# Patient Record
Sex: Male | Born: 1995
Health system: Southern US, Community
[De-identification: ages and names within clinical notes are randomized; demographics above are authoritative.]

## PROBLEM LIST (undated history)

## (undated) HISTORY — PX: OTHER SURGICAL HISTORY: SHX169

## (undated) HISTORY — PX: EYE SURGERY: SHX253

## (undated) HISTORY — PX: TONSILLECTOMY: SUR1361

---
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
## 2015-01-18 ENCOUNTER — Encounter: Payer: BLUE CROSS/BLUE SHIELD | Attending: Family Medicine | Admitting: Dietician

## 2015-01-18 VITALS — Ht 72.0 in | Wt 153.0 lb

## 2015-01-18 DIAGNOSIS — Z713 Dietary counseling and surveillance: Secondary | ICD-10-CM | POA: Insufficient documentation

## 2015-01-18 DIAGNOSIS — K5900 Constipation, unspecified: Secondary | ICD-10-CM | POA: Diagnosis not present

## 2015-01-18 NOTE — Patient Instructions (Signed)
Continue a high fiber diet with adequate fluid intake. Continue the yogurt daily as this helps with your constipation. Recommend breakfast daily.  Eat when you are hungry.  You are choosing healthy choices. Continue exercise most days.  Biking more often for more aerobic exercise.   Have a light snack with carbohydrates prior to aerobic exercise.   If you are feeling shaky, weak, sweaty, or grumpy, these may be signs that your blood sugar is low ant that you need to eat. Have a variety of foods in your diet.  Consider trying Quinoa or whole wheat pancakes. Pay attention to your bodies signals. Please e-mail me with any questions or contact Helen HashimotoJill Shaw, RD at [facility].

## 2015-01-18 NOTE — Progress Notes (Signed)
Medical Nutrition Therapy:  Appt start time: 0800 end time:  0900.   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  He lives with his parents and commutes to school.  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  H is Columbian/American.  He prepares his own meals.  The referral was for constipation.  Patient states that he was having a bowel movement every other day but that it was hard and he was feeling bloated.  Currently states that constipation has resolved.  He started taking a probiotic daily which he reported no success, stopped this, began yogurt daily which worked well and now has 1-2 soft bowel movements daily.  He reports eating about 40 grams fiber daily.  He complains of increased bloating with gluten although reports that he did not eliminate this intentionally.  He desires to decrease his %body fat, tried a ketogenic diet but did not feel well.  He now reports intermittent fasting.  (Fasting from 9pm-12 pm daily.)  Does not eat when he is hungry and just drinks a lot of water to combat the hunger.  He will eat whatever he wants occasionally.  His weight is appropriate for his height and body structure.  He asked further questions regarding the paleo diet and genetics diet.Marland Kitchen  TANITA  BODY COMP RESULTS 01/18/15 151.5 lbs   BMI (kg/m^2) 20.5   Fat Mass (lbs) 20   Fat Free Mass (lbs) 131.5   Total Body Water (lbs) 96.5    Preferred Learning Style:   No preference indicated   Learning Readiness:   Ready   MEDICATIONS: see list   DIETARY INTAKE: 24-hr recall:  B (12-1 AM): skips Snk ( AM): none  L ( PM): 2 servings of oatmeal with almond milk, fruit, 4 eggs with cheese and 100 gm egg whites Snk ( PM): none D ( PM):  Rice, beans, mixed vegetables, 130 gm chicken breast, 100 gm broccoli, shake with water apple, spinach, apple cider vinegar Snk ( PM): sweet potatoes, spoon of natural peanut  butter Beverages:   Usual physical activity: weights for upper and lower body 3 times per week, bike 2-3 times per week for 30 minutes but decreased this recently due to stress fracture and "not feeling like it".  He describes symptoms of weak legs and fatigue which he relates to low blood sugar.  Estimated energy needs: 2700 calories 80 g protein  Progress Towards Goal(s):  In progress.   Nutritional Diagnosis:  NB-1.1 Food and nutrition-related knowledge deficit NB-1.2 Harmful beliefs/attitudes about food or nutrition-related topics (use with caution) As related to weight.  As evidenced by dieting with normal weight.    Intervention:  Nutrition education/counseling regarding healthy eating.  Recommended patient not limit gluten unless advised by MD.  Recommended regularly scheduled meals and to listen to his body and eat when hungry.  Recommended increasing variety in his diet.  Questions answered.  Discussed that current weight is healthy and importance of adequate intake.  Continue a high fiber diet with adequate fluid intake. Continue the yogurt daily as this helps with your constipation. Recommend breakfast daily.  Eat when you are hungry.  You are choosing healthy choices. Continue exercise most days.  Biking more often for more aerobic exercise.   Have a light snack with carbohydrates prior to aerobic exercise.   If you are feeling shaky, weak, sweaty, or grumpy, these may be signs that  your blood sugar is low ant that you need to eat. Have a variety of foods in your diet.  Consider trying Quinoa or whole wheat pancakes. Pay attention to your bodies signals. Please e-mail me with any questions or contact Helen HashimotoJill Shaw, RD at [facility].   Teaching Method Utilized:  Visual Auditory   Handouts given during visit include:  Constipation nutrition therapy  Fiber list  Barriers to learning/adherence to lifestyle change: none  Demonstrated degree of understanding via:  Teach Back    Monitoring/Evaluation:  Dietary intake, exercise, and body weight prn.

## 2015-08-27 ENCOUNTER — Ambulatory Visit (INDEPENDENT_AMBULATORY_CARE_PROVIDER_SITE_OTHER): Payer: BLUE CROSS/BLUE SHIELD | Admitting: Emergency Medicine

## 2015-08-27 VITALS — BP 110/74 | HR 63 | Temp 98.5°F | Resp 12 | Ht 73.0 in | Wt 160.0 lb

## 2015-08-27 DIAGNOSIS — J029 Acute pharyngitis, unspecified: Secondary | ICD-10-CM | POA: Diagnosis not present

## 2015-08-27 LAB — POCT RAPID STREP A (OFFICE): Rapid Strep A Screen: NEGATIVE

## 2015-08-27 MED ORDER — FIRST-DUKES MOUTHWASH MT SUSP: OROMUCOSAL | Status: DC

## 2015-08-27 NOTE — Progress Notes (Addendum)
This chart was scribed for Earl Lites, MD by [facility], medical scribe at [facility].The patient was seen in exam room 12 and the patient's care was started at 11:51 AM.  Chief Complaint:  Chief Complaint  Patient presents with  . Sore Throat    Thrusday Discharge    HPI: Jon Leach is a 20 y.o. male who reports to [facility] today complaining of a sore throat with associated nasal congestion for the past two days. Headache with yesterday but not today. No known sick contacts. No fever, chills or body aches. He studies kinesiology   No past medical history on file. Past Surgical History  Procedure Laterality Date  . Tooth      wisdome tooth removed   Social History   Social History  . Marital Status: Single    Spouse Name: N/A  . Number of Children: N/A  . Years of Education: N/A   Social History Main Topics  . Smoking status: Never Smoker   . Smokeless tobacco: None  . Alcohol Use: No  . Drug Use: No  . Sexual Activity: Yes   Other Topics Concern  . None   Social History Narrative   No family history on file. Allergies  Allergen Reactions  . Penicillins    Prior to Admission medications   Medication Sig Start Date End Date Taking? Authorizing Provider  calcium citrate-vitamin D (CITRACAL+D) 315-200 MG-UNIT per tablet Take 1 tablet by mouth 2 (two) times daily.   Yes Historical Provider, MD  desloratadine-pseudoephedrine (CLARINEX-D 12-HOUR) 2.5-120 MG per tablet Take 1 tablet by mouth 2 (two) times daily.   Yes Historical Provider, MD  Multiple Vitamins-Minerals (MULTIVITAMIN PO) Take by mouth.   Yes Historical Provider, MD  omega-3 acid ethyl esters (LOVAZA) 1 G capsule Take by mouth 2 (two) times daily.   Yes Historical Provider, MD   ROS: The patient denies fevers, chills, night sweats, unintentional weight loss, chest pain, palpitations, wheezing, dyspnea on exertion, nausea, vomiting, abdominal pain, dysuria, hematuria, melena,  numbness, weakness, or tingling.  All other systems have been reviewed and were otherwise negative with the exception of those mentioned in the HPI and as above.    PHYSICAL EXAM: Filed Vitals:   08/27/15 1115  BP: 110/74  Pulse: 63  Temp: 98.5 F (36.9 C)  Resp: 12   Body mass index is 21.11 kg/(m^2).  General: Alert, no acute distress HEENT:  Normocephalic, atraumatic, oropharynx patent. Redness at the posterior oropharynx. There are small bilateral anterior cervical nodes. Eye: [facility] Cardiovascular:  Regular rate and rhythm, no rubs murmurs or gallops.  No Carotid bruits, radial pulse intact. No pedal edema.  Respiratory: Clear to auscultation bilaterally.  No wheezes, rales, or rhonchi.  No cyanosis, no use of accessory musculature Abdominal: No organomegaly, abdomen is soft and non-tender, positive bowel sounds.  No masses. Musculoskeletal: Gait intact. No edema, tenderness Skin: No rashes. Neurologic: Facial musculature symmetric. Psychiatric: Patient acts appropriately throughout our interaction. Lymphatic: No cervical or submandibular lymphadenopathy Genitourinary/Anorectal: No acute findings  LABS: Results for orders placed or performed in visit on 08/27/15  POCT rapid strep A  Result Value Ref Range   Rapid Strep A Screen Negative Negative   ASSESSMENT/PLAN: We'll treat with [facility]'s mouthwash and symptomatically treatment. Throat culture was sent. Gross sideeffects, risk and benefits, and alternatives of medications d/w patient. Patient is aware that all medications have potential sideeffects and we are unable to predict every sideeffect or  drug-drug interaction that may occur.  By signing my name below, I, Nadim Abuhashem, attest that this documentation has been prepared under the direction and in the presence of Earl Lites, MD.  Electronically Signed: Conchita Paris, medical scribe. 08/27/2015, 12:16 AM.  Lesle Chris MD 08/27/2015 11:51 AM

## 2015-08-27 NOTE — Patient Instructions (Signed)

## 2015-08-27 NOTE — Addendum Note (Signed)
Addended by: Lesle Chris A on: 08/27/2015 12:28 PM   Modules accepted: Kipp Brood

## 2015-08-29 LAB — CULTURE, GROUP A STREP: ORGANISM ID, BACTERIA: NORMAL

## 2015-10-31 ENCOUNTER — Other Ambulatory Visit: Payer: Self-pay | Admitting: Family Medicine

## 2015-10-31 DIAGNOSIS — M84374A Stress fracture, right foot, initial encounter for fracture: Secondary | ICD-10-CM

## 2015-11-06 ENCOUNTER — Ambulatory Visit
Admission: RE | Admit: 2015-11-06 | Discharge: 2015-11-06 | Disposition: A | Discharge status: Home / Self Care | Payer: BLUE CROSS/BLUE SHIELD | Source: Ambulatory Visit | Attending: Family Medicine | Admitting: Family Medicine

## 2015-11-06 DIAGNOSIS — M84374A Stress fracture, right foot, initial encounter for fracture: Secondary | ICD-10-CM

## 2016-05-14 ENCOUNTER — Other Ambulatory Visit: Payer: Self-pay | Admitting: Internal Medicine

## 2016-05-14 DIAGNOSIS — M79671 Pain in right foot: Secondary | ICD-10-CM

## 2016-05-14 DIAGNOSIS — M79672 Pain in left foot: Principal | ICD-10-CM

## 2016-05-23 ENCOUNTER — Other Ambulatory Visit: Payer: BLUE CROSS/BLUE SHIELD

## 2016-05-28 ENCOUNTER — Other Ambulatory Visit: Payer: Self-pay | Admitting: Internal Medicine

## 2016-05-28 ENCOUNTER — Ambulatory Visit
Admission: RE | Admit: 2016-05-28 | Discharge: 2016-05-28 | Disposition: A | Discharge status: Home / Self Care | Payer: BLUE CROSS/BLUE SHIELD | Source: Ambulatory Visit | Attending: Internal Medicine | Admitting: Internal Medicine

## 2016-05-28 DIAGNOSIS — M79671 Pain in right foot: Secondary | ICD-10-CM

## 2016-05-28 DIAGNOSIS — E559 Vitamin D deficiency, unspecified: Secondary | ICD-10-CM

## 2016-05-28 DIAGNOSIS — M79672 Pain in left foot: Principal | ICD-10-CM

## 2016-08-31 DIAGNOSIS — M9905 Segmental and somatic dysfunction of pelvic region: Secondary | ICD-10-CM | POA: Diagnosis not present

## 2016-08-31 DIAGNOSIS — M9903 Segmental and somatic dysfunction of lumbar region: Secondary | ICD-10-CM | POA: Diagnosis not present

## 2016-08-31 DIAGNOSIS — M9902 Segmental and somatic dysfunction of thoracic region: Secondary | ICD-10-CM | POA: Diagnosis not present

## 2016-08-31 DIAGNOSIS — M9901 Segmental and somatic dysfunction of cervical region: Secondary | ICD-10-CM | POA: Diagnosis not present

## 2016-09-05 DIAGNOSIS — M545 Low back pain: Secondary | ICD-10-CM | POA: Diagnosis not present

## 2016-09-12 DIAGNOSIS — M545 Low back pain: Secondary | ICD-10-CM | POA: Diagnosis not present

## 2016-09-19 DIAGNOSIS — M545 Low back pain: Secondary | ICD-10-CM | POA: Diagnosis not present

## 2016-09-26 DIAGNOSIS — M545 Low back pain: Secondary | ICD-10-CM | POA: Diagnosis not present

## 2016-10-03 DIAGNOSIS — M545 Low back pain: Secondary | ICD-10-CM | POA: Diagnosis not present

## 2016-10-05 DIAGNOSIS — M9903 Segmental and somatic dysfunction of lumbar region: Secondary | ICD-10-CM | POA: Diagnosis not present

## 2016-10-05 DIAGNOSIS — M9901 Segmental and somatic dysfunction of cervical region: Secondary | ICD-10-CM | POA: Diagnosis not present

## 2016-10-05 DIAGNOSIS — M9905 Segmental and somatic dysfunction of pelvic region: Secondary | ICD-10-CM | POA: Diagnosis not present

## 2016-10-05 DIAGNOSIS — M9902 Segmental and somatic dysfunction of thoracic region: Secondary | ICD-10-CM | POA: Diagnosis not present

## 2016-10-10 DIAGNOSIS — M545 Low back pain: Secondary | ICD-10-CM | POA: Diagnosis not present

## 2016-10-24 DIAGNOSIS — M545 Low back pain: Secondary | ICD-10-CM | POA: Diagnosis not present

## 2016-10-31 DIAGNOSIS — M545 Low back pain: Secondary | ICD-10-CM | POA: Diagnosis not present

## 2016-11-02 DIAGNOSIS — M9902 Segmental and somatic dysfunction of thoracic region: Secondary | ICD-10-CM | POA: Diagnosis not present

## 2016-11-02 DIAGNOSIS — M9903 Segmental and somatic dysfunction of lumbar region: Secondary | ICD-10-CM | POA: Diagnosis not present

## 2016-11-02 DIAGNOSIS — M9901 Segmental and somatic dysfunction of cervical region: Secondary | ICD-10-CM | POA: Diagnosis not present

## 2016-11-02 DIAGNOSIS — M9905 Segmental and somatic dysfunction of pelvic region: Secondary | ICD-10-CM | POA: Diagnosis not present

## 2016-11-07 DIAGNOSIS — M545 Low back pain: Secondary | ICD-10-CM | POA: Diagnosis not present

## 2016-11-29 DIAGNOSIS — M545 Low back pain: Secondary | ICD-10-CM | POA: Diagnosis not present

## 2016-11-29 DIAGNOSIS — M25551 Pain in right hip: Secondary | ICD-10-CM | POA: Diagnosis not present

## 2016-11-29 DIAGNOSIS — M25552 Pain in left hip: Secondary | ICD-10-CM | POA: Diagnosis not present

## 2016-12-20 DIAGNOSIS — M25552 Pain in left hip: Secondary | ICD-10-CM | POA: Diagnosis not present

## 2016-12-20 DIAGNOSIS — M25551 Pain in right hip: Secondary | ICD-10-CM | POA: Diagnosis not present

## 2016-12-20 DIAGNOSIS — M545 Low back pain: Secondary | ICD-10-CM | POA: Diagnosis not present

## 2017-01-01 DIAGNOSIS — M545 Low back pain: Secondary | ICD-10-CM | POA: Diagnosis not present

## 2017-01-01 DIAGNOSIS — M25551 Pain in right hip: Secondary | ICD-10-CM | POA: Diagnosis not present

## 2017-01-01 DIAGNOSIS — M25552 Pain in left hip: Secondary | ICD-10-CM | POA: Diagnosis not present

## 2017-01-10 DIAGNOSIS — M545 Low back pain: Secondary | ICD-10-CM | POA: Diagnosis not present

## 2017-01-10 DIAGNOSIS — M25552 Pain in left hip: Secondary | ICD-10-CM | POA: Diagnosis not present

## 2017-01-10 DIAGNOSIS — M25551 Pain in right hip: Secondary | ICD-10-CM | POA: Diagnosis not present

## 2017-01-15 DIAGNOSIS — M545 Low back pain: Secondary | ICD-10-CM | POA: Diagnosis not present

## 2017-01-15 DIAGNOSIS — M25552 Pain in left hip: Secondary | ICD-10-CM | POA: Diagnosis not present

## 2017-01-15 DIAGNOSIS — M25551 Pain in right hip: Secondary | ICD-10-CM | POA: Diagnosis not present

## 2017-01-22 DIAGNOSIS — M545 Low back pain: Secondary | ICD-10-CM | POA: Diagnosis not present

## 2017-01-22 DIAGNOSIS — M25552 Pain in left hip: Secondary | ICD-10-CM | POA: Diagnosis not present

## 2017-01-22 DIAGNOSIS — M25551 Pain in right hip: Secondary | ICD-10-CM | POA: Diagnosis not present

## 2017-02-07 DIAGNOSIS — M545 Low back pain: Secondary | ICD-10-CM | POA: Diagnosis not present

## 2017-02-07 DIAGNOSIS — M25552 Pain in left hip: Secondary | ICD-10-CM | POA: Diagnosis not present

## 2017-02-07 DIAGNOSIS — M25551 Pain in right hip: Secondary | ICD-10-CM | POA: Diagnosis not present

## 2017-06-10 ENCOUNTER — Encounter (HOSPITAL_COMMUNITY): Payer: Self-pay | Admitting: Emergency Medicine

## 2017-06-10 ENCOUNTER — Ambulatory Visit (HOSPITAL_COMMUNITY)
Admission: EM | Admit: 2017-06-10 | Discharge: 2017-06-10 | Disposition: A | Discharge status: Home / Self Care | Payer: BLUE CROSS/BLUE SHIELD | Attending: Family Medicine | Admitting: Family Medicine

## 2017-06-10 DIAGNOSIS — B349 Viral infection, unspecified: Secondary | ICD-10-CM | POA: Diagnosis not present

## 2017-06-10 DIAGNOSIS — J029 Acute pharyngitis, unspecified: Secondary | ICD-10-CM | POA: Diagnosis not present

## 2017-06-10 DIAGNOSIS — Z880 Allergy status to penicillin: Secondary | ICD-10-CM | POA: Diagnosis not present

## 2017-06-10 LAB — POCT RAPID STREP A: STREPTOCOCCUS, GROUP A SCREEN (DIRECT): NEGATIVE

## 2017-06-10 MED ORDER — IBUPROFEN 800 MG PO TABS
800.0000 mg | ORAL_TABLET | Freq: Once | ORAL | Status: AC
  Administered 2017-06-10: 800 mg via ORAL

## 2017-06-10 MED ORDER — OSELTAMIVIR PHOSPHATE 75 MG PO CAPS: 75.0000 mg | ORAL_CAPSULE | Freq: Two times a day (BID) | ORAL | 0 refills | Status: DC

## 2017-06-10 MED ORDER — IBUPROFEN 800 MG PO TABS
ORAL_TABLET | ORAL | Status: AC
  Filled 2017-06-10: qty 1

## 2017-06-10 NOTE — ED Triage Notes (Signed)
Sore throat, fever, body aches, congestion, chills, and headache that started Saturday.

## 2017-06-10 NOTE — Discharge Instructions (Signed)
The strep test is negative.  We are running a back up test.  In the meantime, I am treating you for the flu.  Ibuprofen/Tylenol for soreness and headache.

## 2017-06-10 NOTE — ED Provider Notes (Signed)
  [facility]   [id number] 06/10/17 Arrival Time: 1555   SUBJECTIVE:  Jon Leach is a 21 y.o. male who presents to the urgent care with complaint of Sore throat, fever, body aches, congestion, chills, and headache that started Saturday.  Works as a host at [employer]. History reviewed. No pertinent past medical history. History reviewed. No pertinent family history. Social History   Socioeconomic History  . Marital status: Single    Spouse name: Not on file  . Number of children: Not on file  . Years of education: Not on file  . Highest education level: Not on file  Social Needs  . Financial resource strain: Not on file  . Food insecurity - worry: Not on file  . Food insecurity - inability: Not on file  . Transportation needs - medical: Not on file  . Transportation needs - non-medical: Not on file  Occupational History  . Not on file  Tobacco Use  . Smoking status: Never Smoker  . Smokeless tobacco: Never Used  Substance and Sexual Activity  . Alcohol use: Yes    Comment: occ.   . Drug use: No  . Sexual activity: Yes  Other Topics Concern  . Not on file  Social History Narrative  . Not on file   No outpatient medications have been marked as taking for the 06/10/17 encounter [facility](Hospital Encounter).   Allergies  Allergen Reactions  . Penicillins       ROS: As per HPI, remainder of ROS negative.   OBJECTIVE:   Vitals:   06/10/17 1611 06/10/17 1612  BP:  106/64  Pulse:  (!) 125  Resp:  16  Temp:  100.1 F (37.8 C)  TempSrc:  Oral  SpO2:  97%  Weight: 150 lb (68 kg)   Height: 6\' 1"  (1.854 m)      General appearance: alert; no distress Eyes: PERRL; EOMI; conjunctiva normal HENT: normocephalic; atraumatic; TMs normal, canal normal, external ears normal without trauma; nasal mucosa normal; very red swollen tonsils. Neck: supple, anterior cervical adenopathy Lungs: clear to auscultation bilaterally Heart: regular rate and rhythm Abdomen:  soft, non-tender; bowel sounds normal; no masses or organomegaly; no guarding or rebound tenderness Back: no CVA tenderness Extremities: no cyanosis or edema; symmetrical with no gross deformities Skin: warm and dry Neurologic: normal gait; grossly normal Psychological: alert and cooperative; normal mood and affect      Labs:  Results for orders placed or performed during the hospital encounter of 06/10/17  POCT rapid strep A [facility]  Result Value Ref Range   Streptococcus, Group A Screen (Direct) NEGATIVE NEGATIVE    Labs Reviewed  POCT RAPID STREP A    No results found.     ASSESSMENT & PLAN:  1. Viral syndrome     Meds ordered this encounter  Medications  . ibuprofen (ADVIL,MOTRIN) tablet 800 mg  . oseltamivir (TAMIFLU) 75 MG capsule    Sig: Take 1 capsule (75 mg total) by mouth every 12 (twelve) hours.    Dispense:  10 capsule    Refill:  0    Reviewed expectations re: course of current medical issues. Questions answered. Outlined signs and symptoms indicating need for more acute intervention. Patient verbalized understanding. After Visit Summary given.    Procedures:      Elvina SidleLauenstein, Layne Lebon, MD 06/10/17 1645

## 2017-06-11 LAB — CULTURE, GROUP A STREP (THRC)

## 2017-06-14 DIAGNOSIS — M9902 Segmental and somatic dysfunction of thoracic region: Secondary | ICD-10-CM | POA: Diagnosis not present

## 2017-06-14 DIAGNOSIS — M9905 Segmental and somatic dysfunction of pelvic region: Secondary | ICD-10-CM | POA: Diagnosis not present

## 2017-06-14 DIAGNOSIS — M9903 Segmental and somatic dysfunction of lumbar region: Secondary | ICD-10-CM | POA: Diagnosis not present

## 2017-06-14 DIAGNOSIS — M9901 Segmental and somatic dysfunction of cervical region: Secondary | ICD-10-CM | POA: Diagnosis not present

## 2017-06-20 DIAGNOSIS — M9903 Segmental and somatic dysfunction of lumbar region: Secondary | ICD-10-CM | POA: Diagnosis not present

## 2017-06-20 DIAGNOSIS — M9902 Segmental and somatic dysfunction of thoracic region: Secondary | ICD-10-CM | POA: Diagnosis not present

## 2017-06-20 DIAGNOSIS — M9905 Segmental and somatic dysfunction of pelvic region: Secondary | ICD-10-CM | POA: Diagnosis not present

## 2017-06-20 DIAGNOSIS — M9901 Segmental and somatic dysfunction of cervical region: Secondary | ICD-10-CM | POA: Diagnosis not present

## 2017-08-26 DIAGNOSIS — M79672 Pain in left foot: Secondary | ICD-10-CM | POA: Diagnosis not present

## 2017-08-26 DIAGNOSIS — M79671 Pain in right foot: Secondary | ICD-10-CM | POA: Diagnosis not present

## 2017-09-19 DIAGNOSIS — L308 Other specified dermatitis: Secondary | ICD-10-CM | POA: Diagnosis not present

## 2017-09-19 DIAGNOSIS — B354 Tinea corporis: Secondary | ICD-10-CM | POA: Diagnosis not present

## 2017-09-19 DIAGNOSIS — L700 Acne vulgaris: Secondary | ICD-10-CM | POA: Diagnosis not present

## 2017-11-18 DIAGNOSIS — M9903 Segmental and somatic dysfunction of lumbar region: Secondary | ICD-10-CM | POA: Diagnosis not present

## 2017-11-18 DIAGNOSIS — M9905 Segmental and somatic dysfunction of pelvic region: Secondary | ICD-10-CM | POA: Diagnosis not present

## 2017-11-18 DIAGNOSIS — M9901 Segmental and somatic dysfunction of cervical region: Secondary | ICD-10-CM | POA: Diagnosis not present

## 2017-11-18 DIAGNOSIS — M9902 Segmental and somatic dysfunction of thoracic region: Secondary | ICD-10-CM | POA: Diagnosis not present

## 2017-11-27 DIAGNOSIS — M9901 Segmental and somatic dysfunction of cervical region: Secondary | ICD-10-CM | POA: Diagnosis not present

## 2017-11-27 DIAGNOSIS — M9903 Segmental and somatic dysfunction of lumbar region: Secondary | ICD-10-CM | POA: Diagnosis not present

## 2017-11-27 DIAGNOSIS — M9902 Segmental and somatic dysfunction of thoracic region: Secondary | ICD-10-CM | POA: Diagnosis not present

## 2017-11-27 DIAGNOSIS — M9905 Segmental and somatic dysfunction of pelvic region: Secondary | ICD-10-CM | POA: Diagnosis not present

## 2017-12-05 DIAGNOSIS — M9901 Segmental and somatic dysfunction of cervical region: Secondary | ICD-10-CM | POA: Diagnosis not present

## 2017-12-05 DIAGNOSIS — M9903 Segmental and somatic dysfunction of lumbar region: Secondary | ICD-10-CM | POA: Diagnosis not present

## 2017-12-05 DIAGNOSIS — M9902 Segmental and somatic dysfunction of thoracic region: Secondary | ICD-10-CM | POA: Diagnosis not present

## 2017-12-05 DIAGNOSIS — M9905 Segmental and somatic dysfunction of pelvic region: Secondary | ICD-10-CM | POA: Diagnosis not present

## 2017-12-11 DIAGNOSIS — M9903 Segmental and somatic dysfunction of lumbar region: Secondary | ICD-10-CM | POA: Diagnosis not present

## 2017-12-11 DIAGNOSIS — M9905 Segmental and somatic dysfunction of pelvic region: Secondary | ICD-10-CM | POA: Diagnosis not present

## 2017-12-11 DIAGNOSIS — M9901 Segmental and somatic dysfunction of cervical region: Secondary | ICD-10-CM | POA: Diagnosis not present

## 2017-12-11 DIAGNOSIS — M9902 Segmental and somatic dysfunction of thoracic region: Secondary | ICD-10-CM | POA: Diagnosis not present

## 2017-12-19 DIAGNOSIS — M9902 Segmental and somatic dysfunction of thoracic region: Secondary | ICD-10-CM | POA: Diagnosis not present

## 2017-12-19 DIAGNOSIS — M9905 Segmental and somatic dysfunction of pelvic region: Secondary | ICD-10-CM | POA: Diagnosis not present

## 2017-12-19 DIAGNOSIS — M9901 Segmental and somatic dysfunction of cervical region: Secondary | ICD-10-CM | POA: Diagnosis not present

## 2017-12-19 DIAGNOSIS — M9903 Segmental and somatic dysfunction of lumbar region: Secondary | ICD-10-CM | POA: Diagnosis not present

## 2017-12-25 DIAGNOSIS — M9901 Segmental and somatic dysfunction of cervical region: Secondary | ICD-10-CM | POA: Diagnosis not present

## 2017-12-25 DIAGNOSIS — M9905 Segmental and somatic dysfunction of pelvic region: Secondary | ICD-10-CM | POA: Diagnosis not present

## 2017-12-25 DIAGNOSIS — M9902 Segmental and somatic dysfunction of thoracic region: Secondary | ICD-10-CM | POA: Diagnosis not present

## 2017-12-25 DIAGNOSIS — M9903 Segmental and somatic dysfunction of lumbar region: Secondary | ICD-10-CM | POA: Diagnosis not present

## 2018-01-01 DIAGNOSIS — M9902 Segmental and somatic dysfunction of thoracic region: Secondary | ICD-10-CM | POA: Diagnosis not present

## 2018-01-01 DIAGNOSIS — M9903 Segmental and somatic dysfunction of lumbar region: Secondary | ICD-10-CM | POA: Diagnosis not present

## 2018-01-01 DIAGNOSIS — M9905 Segmental and somatic dysfunction of pelvic region: Secondary | ICD-10-CM | POA: Diagnosis not present

## 2018-01-01 DIAGNOSIS — M9901 Segmental and somatic dysfunction of cervical region: Secondary | ICD-10-CM | POA: Diagnosis not present

## 2018-01-08 DIAGNOSIS — M9902 Segmental and somatic dysfunction of thoracic region: Secondary | ICD-10-CM | POA: Diagnosis not present

## 2018-01-08 DIAGNOSIS — M9903 Segmental and somatic dysfunction of lumbar region: Secondary | ICD-10-CM | POA: Diagnosis not present

## 2018-01-08 DIAGNOSIS — M9901 Segmental and somatic dysfunction of cervical region: Secondary | ICD-10-CM | POA: Diagnosis not present

## 2018-01-08 DIAGNOSIS — M9905 Segmental and somatic dysfunction of pelvic region: Secondary | ICD-10-CM | POA: Diagnosis not present

## 2018-01-22 DIAGNOSIS — M9903 Segmental and somatic dysfunction of lumbar region: Secondary | ICD-10-CM | POA: Diagnosis not present

## 2018-01-22 DIAGNOSIS — M9905 Segmental and somatic dysfunction of pelvic region: Secondary | ICD-10-CM | POA: Diagnosis not present

## 2018-01-22 DIAGNOSIS — M9902 Segmental and somatic dysfunction of thoracic region: Secondary | ICD-10-CM | POA: Diagnosis not present

## 2018-01-22 DIAGNOSIS — M9901 Segmental and somatic dysfunction of cervical region: Secondary | ICD-10-CM | POA: Diagnosis not present

## 2018-01-29 DIAGNOSIS — M9901 Segmental and somatic dysfunction of cervical region: Secondary | ICD-10-CM | POA: Diagnosis not present

## 2018-01-29 DIAGNOSIS — M9903 Segmental and somatic dysfunction of lumbar region: Secondary | ICD-10-CM | POA: Diagnosis not present

## 2018-01-29 DIAGNOSIS — M9905 Segmental and somatic dysfunction of pelvic region: Secondary | ICD-10-CM | POA: Diagnosis not present

## 2018-01-29 DIAGNOSIS — M9902 Segmental and somatic dysfunction of thoracic region: Secondary | ICD-10-CM | POA: Diagnosis not present

## 2018-02-04 DIAGNOSIS — M9903 Segmental and somatic dysfunction of lumbar region: Secondary | ICD-10-CM | POA: Diagnosis not present

## 2018-02-04 DIAGNOSIS — M9905 Segmental and somatic dysfunction of pelvic region: Secondary | ICD-10-CM | POA: Diagnosis not present

## 2018-02-04 DIAGNOSIS — M9901 Segmental and somatic dysfunction of cervical region: Secondary | ICD-10-CM | POA: Diagnosis not present

## 2018-02-04 DIAGNOSIS — M9902 Segmental and somatic dysfunction of thoracic region: Secondary | ICD-10-CM | POA: Diagnosis not present

## 2018-02-27 DIAGNOSIS — M9901 Segmental and somatic dysfunction of cervical region: Secondary | ICD-10-CM | POA: Diagnosis not present

## 2018-02-27 DIAGNOSIS — M9903 Segmental and somatic dysfunction of lumbar region: Secondary | ICD-10-CM | POA: Diagnosis not present

## 2018-02-27 DIAGNOSIS — M9902 Segmental and somatic dysfunction of thoracic region: Secondary | ICD-10-CM | POA: Diagnosis not present

## 2018-02-27 DIAGNOSIS — M9905 Segmental and somatic dysfunction of pelvic region: Secondary | ICD-10-CM | POA: Diagnosis not present

## 2018-03-08 DIAGNOSIS — M9901 Segmental and somatic dysfunction of cervical region: Secondary | ICD-10-CM | POA: Diagnosis not present

## 2018-03-08 DIAGNOSIS — M9905 Segmental and somatic dysfunction of pelvic region: Secondary | ICD-10-CM | POA: Diagnosis not present

## 2018-03-08 DIAGNOSIS — M9902 Segmental and somatic dysfunction of thoracic region: Secondary | ICD-10-CM | POA: Diagnosis not present

## 2018-03-08 DIAGNOSIS — M9903 Segmental and somatic dysfunction of lumbar region: Secondary | ICD-10-CM | POA: Diagnosis not present

## 2018-03-11 DIAGNOSIS — M25512 Pain in left shoulder: Secondary | ICD-10-CM | POA: Diagnosis not present

## 2018-03-11 DIAGNOSIS — M25511 Pain in right shoulder: Secondary | ICD-10-CM | POA: Diagnosis not present

## 2018-03-20 DIAGNOSIS — M25511 Pain in right shoulder: Secondary | ICD-10-CM | POA: Diagnosis not present

## 2018-03-20 DIAGNOSIS — M25512 Pain in left shoulder: Secondary | ICD-10-CM | POA: Diagnosis not present

## 2018-03-25 DIAGNOSIS — M25512 Pain in left shoulder: Secondary | ICD-10-CM | POA: Diagnosis not present

## 2018-03-25 DIAGNOSIS — M25511 Pain in right shoulder: Secondary | ICD-10-CM | POA: Diagnosis not present

## 2018-04-03 DIAGNOSIS — M25511 Pain in right shoulder: Secondary | ICD-10-CM | POA: Diagnosis not present

## 2018-04-03 DIAGNOSIS — M25512 Pain in left shoulder: Secondary | ICD-10-CM | POA: Diagnosis not present

## 2018-04-08 DIAGNOSIS — M25511 Pain in right shoulder: Secondary | ICD-10-CM | POA: Diagnosis not present

## 2018-04-08 DIAGNOSIS — M25512 Pain in left shoulder: Secondary | ICD-10-CM | POA: Diagnosis not present

## 2018-04-15 DIAGNOSIS — M25512 Pain in left shoulder: Secondary | ICD-10-CM | POA: Diagnosis not present

## 2018-04-15 DIAGNOSIS — M25511 Pain in right shoulder: Secondary | ICD-10-CM | POA: Diagnosis not present

## 2018-04-21 DIAGNOSIS — K602 Anal fissure, unspecified: Secondary | ICD-10-CM | POA: Diagnosis not present

## 2018-04-21 DIAGNOSIS — K625 Hemorrhage of anus and rectum: Secondary | ICD-10-CM | POA: Diagnosis not present

## 2018-04-21 DIAGNOSIS — K6289 Other specified diseases of anus and rectum: Secondary | ICD-10-CM | POA: Diagnosis not present

## 2018-04-24 DIAGNOSIS — M25511 Pain in right shoulder: Secondary | ICD-10-CM | POA: Diagnosis not present

## 2018-04-24 DIAGNOSIS — M25512 Pain in left shoulder: Secondary | ICD-10-CM | POA: Diagnosis not present

## 2018-04-27 DIAGNOSIS — Z111 Encounter for screening for respiratory tuberculosis: Secondary | ICD-10-CM | POA: Diagnosis not present

## 2018-04-27 DIAGNOSIS — Z23 Encounter for immunization: Secondary | ICD-10-CM | POA: Diagnosis not present

## 2018-04-29 DIAGNOSIS — M25512 Pain in left shoulder: Secondary | ICD-10-CM | POA: Diagnosis not present

## 2018-04-29 DIAGNOSIS — M25511 Pain in right shoulder: Secondary | ICD-10-CM | POA: Diagnosis not present

## 2018-04-29 DIAGNOSIS — Z111 Encounter for screening for respiratory tuberculosis: Secondary | ICD-10-CM | POA: Diagnosis not present

## 2018-04-30 DIAGNOSIS — M9902 Segmental and somatic dysfunction of thoracic region: Secondary | ICD-10-CM | POA: Diagnosis not present

## 2018-04-30 DIAGNOSIS — M9901 Segmental and somatic dysfunction of cervical region: Secondary | ICD-10-CM | POA: Diagnosis not present

## 2018-04-30 DIAGNOSIS — M9903 Segmental and somatic dysfunction of lumbar region: Secondary | ICD-10-CM | POA: Diagnosis not present

## 2018-04-30 DIAGNOSIS — M9905 Segmental and somatic dysfunction of pelvic region: Secondary | ICD-10-CM | POA: Diagnosis not present

## 2018-05-02 DIAGNOSIS — M9905 Segmental and somatic dysfunction of pelvic region: Secondary | ICD-10-CM | POA: Diagnosis not present

## 2018-05-02 DIAGNOSIS — M9903 Segmental and somatic dysfunction of lumbar region: Secondary | ICD-10-CM | POA: Diagnosis not present

## 2018-05-02 DIAGNOSIS — M9901 Segmental and somatic dysfunction of cervical region: Secondary | ICD-10-CM | POA: Diagnosis not present

## 2018-05-02 DIAGNOSIS — M9902 Segmental and somatic dysfunction of thoracic region: Secondary | ICD-10-CM | POA: Diagnosis not present

## 2018-05-07 DIAGNOSIS — M25511 Pain in right shoulder: Secondary | ICD-10-CM | POA: Diagnosis not present

## 2018-05-07 DIAGNOSIS — M25512 Pain in left shoulder: Secondary | ICD-10-CM | POA: Diagnosis not present

## 2018-05-14 DIAGNOSIS — M25512 Pain in left shoulder: Secondary | ICD-10-CM | POA: Diagnosis not present

## 2018-05-14 DIAGNOSIS — M25511 Pain in right shoulder: Secondary | ICD-10-CM | POA: Diagnosis not present

## 2018-05-19 DIAGNOSIS — K602 Anal fissure, unspecified: Secondary | ICD-10-CM | POA: Diagnosis not present

## 2018-05-28 DIAGNOSIS — M25511 Pain in right shoulder: Secondary | ICD-10-CM | POA: Diagnosis not present

## 2018-05-28 DIAGNOSIS — M25512 Pain in left shoulder: Secondary | ICD-10-CM | POA: Diagnosis not present

## 2018-06-05 DIAGNOSIS — M25511 Pain in right shoulder: Secondary | ICD-10-CM | POA: Diagnosis not present

## 2018-06-05 DIAGNOSIS — M25512 Pain in left shoulder: Secondary | ICD-10-CM | POA: Diagnosis not present

## 2018-06-12 DIAGNOSIS — M25511 Pain in right shoulder: Secondary | ICD-10-CM | POA: Diagnosis not present

## 2018-06-12 DIAGNOSIS — M25512 Pain in left shoulder: Secondary | ICD-10-CM | POA: Diagnosis not present

## 2018-06-16 DIAGNOSIS — M25512 Pain in left shoulder: Secondary | ICD-10-CM | POA: Diagnosis not present

## 2018-06-16 DIAGNOSIS — M25511 Pain in right shoulder: Secondary | ICD-10-CM | POA: Diagnosis not present

## 2018-07-08 DIAGNOSIS — J0140 Acute pansinusitis, unspecified: Secondary | ICD-10-CM | POA: Diagnosis not present

## 2018-07-09 ENCOUNTER — Encounter: Payer: Self-pay | Admitting: Emergency Medicine

## 2018-07-28 DIAGNOSIS — L218 Other seborrheic dermatitis: Secondary | ICD-10-CM | POA: Diagnosis not present

## 2018-07-29 ENCOUNTER — Ambulatory Visit: Payer: BLUE CROSS/BLUE SHIELD | Admitting: Emergency Medicine

## 2018-07-29 DIAGNOSIS — M9901 Segmental and somatic dysfunction of cervical region: Secondary | ICD-10-CM | POA: Diagnosis not present

## 2018-07-29 DIAGNOSIS — M9902 Segmental and somatic dysfunction of thoracic region: Secondary | ICD-10-CM | POA: Diagnosis not present

## 2018-07-29 DIAGNOSIS — M9905 Segmental and somatic dysfunction of pelvic region: Secondary | ICD-10-CM | POA: Diagnosis not present

## 2018-07-29 DIAGNOSIS — M9903 Segmental and somatic dysfunction of lumbar region: Secondary | ICD-10-CM | POA: Diagnosis not present

## 2018-08-01 DIAGNOSIS — M25511 Pain in right shoulder: Secondary | ICD-10-CM | POA: Diagnosis not present

## 2018-08-01 DIAGNOSIS — M25512 Pain in left shoulder: Secondary | ICD-10-CM | POA: Diagnosis not present

## 2018-08-05 DIAGNOSIS — M9902 Segmental and somatic dysfunction of thoracic region: Secondary | ICD-10-CM | POA: Diagnosis not present

## 2018-08-05 DIAGNOSIS — M9901 Segmental and somatic dysfunction of cervical region: Secondary | ICD-10-CM | POA: Diagnosis not present

## 2018-08-05 DIAGNOSIS — M9905 Segmental and somatic dysfunction of pelvic region: Secondary | ICD-10-CM | POA: Diagnosis not present

## 2018-08-05 DIAGNOSIS — M9903 Segmental and somatic dysfunction of lumbar region: Secondary | ICD-10-CM | POA: Diagnosis not present

## 2018-08-07 ENCOUNTER — Encounter: Payer: Self-pay | Admitting: Emergency Medicine

## 2018-08-07 ENCOUNTER — Ambulatory Visit (INDEPENDENT_AMBULATORY_CARE_PROVIDER_SITE_OTHER): Payer: BLUE CROSS/BLUE SHIELD | Admitting: Emergency Medicine

## 2018-08-07 ENCOUNTER — Other Ambulatory Visit: Payer: Self-pay

## 2018-08-07 VITALS — BP 99/61 | HR 78 | Temp 98.7°F | Resp 16 | Ht 72.5 in | Wt 161.4 lb

## 2018-08-07 DIAGNOSIS — Z0001 Encounter for general adult medical examination with abnormal findings: Secondary | ICD-10-CM | POA: Diagnosis not present

## 2018-08-07 DIAGNOSIS — Z13228 Encounter for screening for other metabolic disorders: Secondary | ICD-10-CM

## 2018-08-07 DIAGNOSIS — R300 Dysuria: Secondary | ICD-10-CM | POA: Diagnosis not present

## 2018-08-07 DIAGNOSIS — R350 Frequency of micturition: Secondary | ICD-10-CM

## 2018-08-07 DIAGNOSIS — Z1329 Encounter for screening for other suspected endocrine disorder: Secondary | ICD-10-CM | POA: Diagnosis not present

## 2018-08-07 DIAGNOSIS — Z0000 Encounter for general adult medical examination without abnormal findings: Secondary | ICD-10-CM | POA: Diagnosis not present

## 2018-08-07 DIAGNOSIS — Z13220 Encounter for screening for lipoid disorders: Secondary | ICD-10-CM

## 2018-08-07 DIAGNOSIS — Z130 Encounter for screening for diseases of the blood and blood-forming organs and certain disorders involving the immune mechanism: Secondary | ICD-10-CM

## 2018-08-07 DIAGNOSIS — Z114 Encounter for screening for human immunodeficiency virus [HIV]: Secondary | ICD-10-CM

## 2018-08-07 LAB — POCT URINALYSIS DIP (MANUAL ENTRY)
Bilirubin, UA: NEGATIVE
Blood, UA: NEGATIVE
GLUCOSE UA: NEGATIVE mg/dL
Ketones, POC UA: NEGATIVE mg/dL
LEUKOCYTES UA: NEGATIVE
NITRITE UA: NEGATIVE
PROTEIN UA: NEGATIVE mg/dL
Spec Grav, UA: 1.01 (ref 1.010–1.025)
UROBILINOGEN UA: 0.2 U/dL
pH, UA: 7 (ref 5.0–8.0)

## 2018-08-07 NOTE — Progress Notes (Signed)
Jon Leach 23 y.o.   Chief Complaint  Patient presents with  . Annual Exam  . Establish Care  . Urinary Frequency     per patient burns when urinating x 1 week    HISTORY OF PRESENT ILLNESS: This is a 23 y.o. male here for his annual exam and to establish care. No chronic medical conditions.  On no chronic medications. Non-smoker.  Exercises regularly.  Drinks plenty of water during the day.  Eats well. Complaining of urinary frequency for years but worse the last 2 weeks with burning on urination. Sexually active.  Uses condoms for protection.  Denies any other significant symptoms.  HPI   Prior to Admission medications   Medication Sig Start Date End Date Taking? Authorizing Provider  calcium citrate-vitamin D (CITRACAL+D) 315-200 MG-UNIT per tablet Take 1 tablet by mouth 2 (two) times daily.    [provider]  Multiple Vitamins-Minerals (MULTIVITAMIN PO) Take by mouth.    [provider]  omega-3 acid ethyl esters (LOVAZA) 1 G capsule Take by mouth 2 (two) times daily.    [provider]    Allergies  Allergen Reactions  . Penicillins     There are no active problems to display for this patient.   No past medical history on file.  Past Surgical History:  Procedure Laterality Date  . tooth     wisdome tooth removed    Social History   Socioeconomic History  . Marital status: Single    Spouse name: Not on file  . Number of children: Not on file  . Years of education: Not on file  . Highest education level: Not on file  Occupational History  . Not on file  Social Needs  . Financial resource strain: Not on file  . Food insecurity:    Worry: Not on file    Inability: Not on file  . Transportation needs:    Medical: Not on file    Non-medical: Not on file  Tobacco Use  . Smoking status: Never Smoker  . Smokeless tobacco: Never Used  Substance and Sexual Activity  . Alcohol use: Yes    Comment: occ.   . Drug use: No  .  Sexual activity: Yes  Lifestyle  . Physical activity:    Days per week: Not on file    Minutes per session: Not on file  . Stress: Not on file  Relationships  . Social connections:    Talks on phone: Not on file    Gets together: Not on file    Attends religious service: Not on file    Active member of club or organization: Not on file    Attends meetings of clubs or organizations: Not on file    Relationship status: Not on file  . Intimate partner violence:    Fear of current or ex partner: Not on file    Emotionally abused: Not on file    Physically abused: Not on file    Forced sexual activity: Not on file  Other Topics Concern  . Not on file  Social History Narrative  . Not on file    No family history on file.   Review of Systems  Constitutional: Negative.  Negative for chills and fever.  HENT: Negative.   Eyes: Negative.  Negative for blurred vision and double vision.  Respiratory: Negative.  Negative for cough and shortness of breath.   Cardiovascular: Negative.  Negative for chest pain and palpitations.  Gastrointestinal: Negative.  Negative for abdominal pain, diarrhea, nausea and vomiting.  Genitourinary: Positive for dysuria and frequency. Negative for flank pain, hematuria and urgency.  Skin: Negative.  Negative for rash.  Neurological: Negative for dizziness and headaches.  Endo/Heme/Allergies: Negative.   All other systems reviewed and are negative.  Vitals:   08/07/18 1409  BP: 99/61  Pulse: 78  Resp: 16  Temp: 98.7 F (37.1 C)  SpO2: 99%     Physical Exam Vitals signs reviewed.  Constitutional:      Appearance: Normal appearance.  HENT:     Head: Normocephalic and atraumatic.     Nose: Nose normal.     Mouth/Throat:     Mouth: Mucous membranes are moist.     Pharynx: Oropharynx is clear.  Eyes:     Extraocular Movements: Extraocular movements intact.     Conjunctiva/sclera: Conjunctivae normal.     Pupils: Pupils are equal, round, and  reactive to light.  Neck:     Musculoskeletal: Normal range of motion and neck supple.  Cardiovascular:     Rate and Rhythm: Normal rate and regular rhythm.     Heart sounds: Normal heart sounds.  Pulmonary:     Effort: Pulmonary effort is normal.     Breath sounds: Normal breath sounds.  Abdominal:     General: There is no distension.     Palpations: Abdomen is soft. There is no mass.     Tenderness: There is no abdominal tenderness.  Musculoskeletal: Normal range of motion.        General: No swelling or tenderness.  Lymphadenopathy:     Cervical: No cervical adenopathy.  Skin:    General: Skin is warm and dry.     Capillary Refill: Capillary refill takes less than 2 seconds.  Neurological:     General: No focal deficit present.     Mental Status: He is alert and oriented to person, place, and time.    Results for orders placed or performed in visit on 08/07/18 (from the past 24 hour(s))  POCT urinalysis dipstick     Status: None   Collection Time: 08/07/18  2:33 PM  Result Value Ref Range   Color, UA yellow yellow   Clarity, UA clear clear   Glucose, UA negative negative mg/dL   Bilirubin, UA negative negative   Ketones, POC UA negative negative mg/dL   Spec Grav, UA [id number] - 1.025   Blood, UA negative negative   pH, UA 7.0 5.0 - 8.0   Protein Ur, POC negative negative mg/dL   Urobilinogen, UA 0.2 0.2 or 1.0 E.U./dL   Nitrite, UA Negative Negative   Leukocytes, UA Negative Negative     ASSESSMENT & PLAN: Jon RuizJohn was seen today for annual exam, establish care and urinary frequency.  Diagnoses and all orders for this visit:  Routine general medical examination at a health care facility -     Comprehensive metabolic panel  Urinary frequency -     POCT urinalysis dipstick -     Urine Culture -     Hemoglobin A1c  Dysuria -     GC/Chlamydia Probe Amp  Screening for deficiency anemia -     CBC with Differential  Screening for lipoid disorders -     Lipid  panel  Screening for endocrine, metabolic and immunity disorder -     Hemoglobin A1c -     Comprehensive metabolic panel  Screening for HIV (human immunodeficiency virus) -     HIV Antibody (  routine testing w rflx)    Patient Instructions       If you have lab work done today you will be contacted with your lab results within the next 2 weeks.  If you have not heard from [place] then please contact us. The fastest way to get your results is to register for My Chart.   IF you received an x-ray today, you will receive an invoice from [facility]. Please contact [facility] at [phone] with questions or concerns regarding your invoice.   IF you received labwork today, you will receive an invoice from [facility]. Please contact [facility] at [phone] with questions or concerns regarding your invoice.   Our billing staff will not be able to assist you with questions regarding bills from these companies.  You will be contacted with the lab results as soon as they are available. The fastest way to get your results is to activate your My Chart account. Instructions are located on the last page of this paperwork. If you have not heard from [place] regarding the results in 2 weeks, please contact this office.      Health Maintenance, Male A healthy lifestyle and preventive care is important for your health and wellness. Ask your health care provider about what schedule of regular examinations is right for you. What should I know about weight and diet? Eat a Healthy Diet  Eat plenty of vegetables, fruits, whole grains, low-fat dairy products, and lean protein.  Do not eat a lot of foods high in solid fats, added sugars, or salt.  Maintain a Healthy Weight Regular exercise can help you achieve or maintain a healthy weight. You should:  Do at least 150 minutes of exercise each week. The exercise should increase your heart rate and make you sweat (moderate-intensity  exercise).  Do strength-training exercises at least twice a week. Watch Your Levels of Cholesterol and Blood Lipids  Have your blood tested for lipids and cholesterol every 5 years starting at 23 years of age. If you are at high risk for heart disease, you should start having your blood tested when you are 23 years old. You may need to have your cholesterol levels checked more often if: ? Your lipid or cholesterol levels are high. ? You are older than 23 years of age. ? You are at high risk for heart disease. What should I know about cancer screening? Many types of cancers can be detected early and may often be prevented. Lung Cancer  You should be screened every year for lung cancer if: ? You are a current smoker who has smoked for at least 30 years. ? You are a former smoker who has quit within the past 15 years.  Talk to your health care provider about your screening options, when you should start screening, and how often you should be screened. Colorectal Cancer  Routine colorectal cancer screening usually begins at 23 years of age and should be repeated every 5-10 years until you are 23 years old. You may need to be screened more often if early forms of precancerous polyps or small growths are found. Your health care provider may recommend screening at an earlier age if you have risk factors for colon cancer.  Your health care provider may recommend using home test kits to check for hidden blood in the stool.  A small camera at the end of a tube can be used to examine your colon (sigmoidoscopy or colonoscopy). This checks for the earliest forms of  colorectal cancer. Prostate and Testicular Cancer  Depending on your age and overall health, your health care provider may do certain tests to screen for prostate and testicular cancer.  Talk to your health care provider about any symptoms or concerns you have about testicular or prostate cancer. Skin Cancer  Check your skin from head  to toe regularly.  Tell your health care provider about any new moles or changes in moles, especially if: ? There is a change in a mole's size, shape, or color. ? You have a mole that is larger than a pencil eraser.  Always use sunscreen. Apply sunscreen liberally and repeat throughout the day.  Protect yourself by wearing long sleeves, pants, a wide-brimmed hat, and sunglasses when outside. What should I know about heart disease, diabetes, and high blood pressure?  If you are 6818-23 years of age, have your blood pressure checked every 3-5 years. If you are 23 years of age or older, have your blood pressure checked every year. You should have your blood pressure measured twice-once when you are at a hospital or clinic, and once when you are not at a hospital or clinic. Record the average of the two measurements. To check your blood pressure when you are not at a hospital or clinic, you can use: ? An automated blood pressure machine at a pharmacy. ? A home blood pressure monitor.  Talk to your health care provider about your target blood pressure.  If you are between 5645-23 years old, ask your health care provider if you should take aspirin to prevent heart disease.  Have regular diabetes screenings by checking your fasting blood sugar level. ? If you are at a normal weight and have a low risk for diabetes, have this test once every three years after the age of 23. ? If you are overweight and have a high risk for diabetes, consider being tested at a younger age or more often.  A one-time screening for abdominal aortic aneurysm (AAA) by ultrasound is recommended for men aged 65-75 years who are current or former smokers. What should I know about preventing infection? Hepatitis B If you have a higher risk for hepatitis B, you should be screened for this virus. Talk with your health care provider to find out if you are at risk for hepatitis B infection. Hepatitis C Blood testing is recommended  for:  Everyone born from [birth year] through 1965.  Anyone with known risk factors for hepatitis C. Sexually Transmitted Diseases (STDs)  You should be screened each year for STDs including gonorrhea and chlamydia if: ? You are sexually active and are younger than 23 years of age. ? You are older than 23 years of age and your health care provider tells you that you are at risk for this type of infection. ? Your sexual activity has changed since you were last screened and you are at an increased risk for chlamydia or gonorrhea. Ask your health care provider if you are at risk.  Talk with your health care provider about whether you are at high risk of being infected with HIV. Your health care provider may recommend a prescription medicine to help prevent HIV infection. What else can I do?  Schedule regular health, dental, and eye exams.  Stay current with your vaccines (immunizations).  Do not use any tobacco products, such as cigarettes, chewing tobacco, and e-cigarettes. If you need help quitting, ask your health care provider.  Limit alcohol intake to no more than 2 drinks per  day. One drink equals 12 ounces of beer, 5 ounces of wine, or 1 ounces of hard liquor.  Do not use street drugs.  Do not share needles.  Ask your health care provider for help if you need support or information about quitting drugs.  Tell your health care provider if you often feel depressed.  Tell your health care provider if you have ever been abused or do not feel safe at home. This information is not intended to replace advice given to you by your health care provider. Make sure you discuss any questions you have with your health care provider. Document Released: 12/15/2007 Document Revised: 02/15/2016 Document Reviewed: 03/22/2015 Elsevier Interactive Patient Education  2019 Elsevier Inc.      Edwina Barth, MD [facility]

## 2018-08-07 NOTE — Patient Instructions (Addendum)

## 2018-08-08 LAB — URINE CULTURE

## 2018-08-08 LAB — COMPREHENSIVE METABOLIC PANEL
ALBUMIN: 4.7 g/dL (ref 4.1–5.2)
ALK PHOS: 82 IU/L (ref 39–117)
ALT: 44 IU/L (ref 0–44)
AST: 29 IU/L (ref 0–40)
Albumin/Globulin Ratio: 1.8 (ref 1.2–2.2)
BILIRUBIN TOTAL: 0.4 mg/dL (ref 0.0–1.2)
BUN / CREAT RATIO: 12 (ref 9–20)
BUN: 10 mg/dL (ref 6–20)
CO2: 23 mmol/L (ref 20–29)
CREATININE: 0.84 mg/dL (ref 0.76–1.27)
Calcium: 9.5 mg/dL (ref 8.7–10.2)
Chloride: 101 mmol/L (ref 96–106)
GFR calc non Af Amer: 124 mL/min/{1.73_m2} (ref >59)
GFR, EST AFRICAN AMERICAN: 144 mL/min/{1.73_m2} (ref >59)
GLOBULIN, TOTAL: 2.6 g/dL (ref 1.5–4.5)
GLUCOSE: 82 mg/dL (ref 65–99)
Potassium: 4.2 mmol/L (ref 3.5–5.2)
Sodium: 141 mmol/L (ref 134–144)
Total Protein: 7.3 g/dL (ref 6.0–8.5)

## 2018-08-08 LAB — CBC WITH DIFFERENTIAL/PLATELET
BASOS: 0 %
Basophils Absolute: 0 10*3/uL (ref 0.0–0.2)
EOS (ABSOLUTE): 0.2 10*3/uL (ref 0.0–0.4)
Eos: 3 %
Hematocrit: 44.4 % (ref 37.5–51.0)
Hemoglobin: 15 g/dL (ref 13.0–17.7)
IMMATURE GRANULOCYTES: 0 %
Immature Grans (Abs): 0 10*3/uL (ref 0.0–0.1)
Lymphocytes Absolute: 3.3 10*3/uL — ABNORMAL HIGH (ref 0.7–3.1)
Lymphs: 45 %
MCH: 32 pg (ref 26.6–33.0)
MCHC: 33.8 g/dL (ref 31.5–35.7)
MCV: 95 fL (ref 79–97)
MONOS ABS: 0.8 10*3/uL (ref 0.1–0.9)
Monocytes: 10 %
NEUTROS PCT: 42 %
Neutrophils Absolute: 3.1 10*3/uL (ref 1.4–7.0)
PLATELETS: 213 10*3/uL (ref 150–450)
RBC: 4.69 x10E6/uL (ref 4.14–5.80)
RDW: 12.7 % (ref 11.6–15.4)
WBC: 7.5 10*3/uL (ref 3.4–10.8)

## 2018-08-08 LAB — LIPID PANEL
Chol/HDL Ratio: 1.9 ratio (ref 0.0–5.0)
Cholesterol, Total: 124 mg/dL (ref 100–199)
HDL: 67 mg/dL (ref >39)
LDL CALC: 48 mg/dL (ref 0–99)
Triglycerides: 44 mg/dL (ref 0–149)
VLDL CHOLESTEROL CAL: 9 mg/dL (ref 5–40)

## 2018-08-08 LAB — HEMOGLOBIN A1C
Est. average glucose Bld gHb Est-mCnc: 103 mg/dL
Hgb A1c MFr Bld: 5.2 % (ref 4.8–5.6)

## 2018-08-08 LAB — HIV ANTIBODY (ROUTINE TESTING W REFLEX): HIV SCREEN 4TH GENERATION: NONREACTIVE

## 2018-08-10 LAB — GC/CHLAMYDIA PROBE AMP
Chlamydia trachomatis, NAA: NEGATIVE
Neisseria gonorrhoeae by PCR: NEGATIVE

## 2018-08-13 DIAGNOSIS — M25511 Pain in right shoulder: Secondary | ICD-10-CM | POA: Diagnosis not present

## 2018-08-13 DIAGNOSIS — M25512 Pain in left shoulder: Secondary | ICD-10-CM | POA: Diagnosis not present

## 2018-08-19 ENCOUNTER — Encounter: Payer: Self-pay | Admitting: *Deleted

## 2018-09-09 DIAGNOSIS — M9901 Segmental and somatic dysfunction of cervical region: Secondary | ICD-10-CM | POA: Diagnosis not present

## 2018-09-09 DIAGNOSIS — M9903 Segmental and somatic dysfunction of lumbar region: Secondary | ICD-10-CM | POA: Diagnosis not present

## 2018-09-09 DIAGNOSIS — M9905 Segmental and somatic dysfunction of pelvic region: Secondary | ICD-10-CM | POA: Diagnosis not present

## 2018-09-09 DIAGNOSIS — M9902 Segmental and somatic dysfunction of thoracic region: Secondary | ICD-10-CM | POA: Diagnosis not present

## 2018-09-10 DIAGNOSIS — M25552 Pain in left hip: Secondary | ICD-10-CM | POA: Diagnosis not present

## 2018-09-10 DIAGNOSIS — M545 Low back pain: Secondary | ICD-10-CM | POA: Diagnosis not present

## 2018-09-10 DIAGNOSIS — M25551 Pain in right hip: Secondary | ICD-10-CM | POA: Diagnosis not present

## 2018-09-17 DIAGNOSIS — M25512 Pain in left shoulder: Secondary | ICD-10-CM | POA: Diagnosis not present

## 2018-09-17 DIAGNOSIS — M25511 Pain in right shoulder: Secondary | ICD-10-CM | POA: Diagnosis not present

## 2018-09-24 DIAGNOSIS — M25512 Pain in left shoulder: Secondary | ICD-10-CM | POA: Diagnosis not present

## 2018-09-24 DIAGNOSIS — M25511 Pain in right shoulder: Secondary | ICD-10-CM | POA: Diagnosis not present

## 2018-12-17 ENCOUNTER — Other Ambulatory Visit: Payer: Self-pay

## 2018-12-17 ENCOUNTER — Ambulatory Visit: Payer: BLUE CROSS/BLUE SHIELD | Admitting: Family Medicine

## 2018-12-17 ENCOUNTER — Encounter: Payer: Self-pay | Admitting: Family Medicine

## 2018-12-17 ENCOUNTER — Encounter: Payer: Self-pay | Admitting: Emergency Medicine

## 2018-12-17 VITALS — BP 110/62 | HR 80 | Temp 98.9°F | Resp 18 | Ht 74.33 in | Wt 153.6 lb

## 2018-12-17 DIAGNOSIS — J0300 Acute streptococcal tonsillitis, unspecified: Secondary | ICD-10-CM | POA: Diagnosis not present

## 2018-12-17 DIAGNOSIS — J039 Acute tonsillitis, unspecified: Secondary | ICD-10-CM | POA: Diagnosis not present

## 2018-12-17 LAB — POCT RAPID STREP A (OFFICE): Rapid Strep A Screen: POSITIVE — AB

## 2018-12-17 MED ORDER — CLINDAMYCIN HCL 300 MG PO CAPS: 300.0000 mg | ORAL_CAPSULE | Freq: Three times a day (TID) | ORAL | 0 refills | Status: DC

## 2018-12-17 NOTE — Patient Instructions (Signed)
° ° ° °  If you have lab work done today you will be contacted with your lab results within the next 2 weeks.  If you have not heard from us then please contact us. The fastest way to get your results is to register for My Chart. ° ° °IF you received an x-ray today, you will receive an invoice from [facility]. Please contact [facility] at [phone] with questions or concerns regarding your invoice.  ° °IF you received labwork today, you will receive an invoice from [facility]. Please contact [facility] at [phone] with questions or concerns regarding your invoice.  ° °Our billing staff will not be able to assist you with questions regarding bills from these companies. ° °You will be contacted with the lab results as soon as they are available. The fastest way to get your results is to activate your My Chart account. Instructions are located on the last page of this paperwork. If you have not heard from us regarding the results in 2 weeks, please contact this office. °  ° ° ° °

## 2018-12-17 NOTE — Progress Notes (Signed)
Acute Office Visit  Subjective:    Patient ID: Jon Leach, male    DOB: [birth year], 23 y.o.   MRN: [id number]  Chief Complaint  Patient presents with  . Sore Throat    X 7-8 days on right side  . Headache    X 7-8 days off and on    HPI Patient is in today for sore throat and headache. Pt denies temperature elevation or cough. Pt states bilat enlarged tonsils are causing repetitve infections. The swelling is causing pt to have difficulty swallowing and breathing. Pt snores but does not seem to wake from apnea. Pt states recurrent enlarged tonsil with desire to have them removed  Recurrent sore throat  Past Surgical History:  Procedure Laterality Date  . tooth     wisdome tooth removed    History reviewed. No pertinent family history.  Social History   Socioeconomic History  . Marital status: Significant Other    Spouse name: Not on file  . Number of children: 0  . Years of education: Not on file  . Highest education level: Not on file  Occupational History  . Not on file  Social Needs  . Financial resource strain: Not on file  . Food insecurity    Worry: Not on file    Inability: Not on file  . Transportation needs    Medical: Not on file    Non-medical: Not on file  Tobacco Use  . Smoking status: Never Smoker  . Smokeless tobacco: Never Used  Substance and Sexual Activity  . Alcohol use: Yes    Comment: occ.   . Drug use: No  . Sexual activity: Yes  Lifestyle  . Physical activity    Days per week: Not on file    Minutes per session: Not on file  . Stress: Not on file  Relationships  . Social Musicianconnections    Talks on phone: Not on file    Gets together: Not on file    Attends religious service: Not on file    Active member of club or organization: Not on file    Attends meetings of clubs or organizations: Not on file    Relationship status: Not on file  . Intimate partner violence    Fear of current or ex partner: Not on file    Emotionally  abused: Not on file    Physically abused: Not on file    Forced sexual activity: Not on file  Other Topics Concern  . Not on file  Social History Narrative  . Not on file    Outpatient Medications Prior to Visit  Medication Sig Dispense Refill  . calcium citrate-vitamin D (CITRACAL+D) 315-200 MG-UNIT per tablet Take 1 tablet by mouth 2 (two) times daily.    . Multiple Vitamins-Minerals (MULTIVITAMIN PO) Take by mouth.    . omega-3 acid ethyl esters (LOVAZA) 1 G capsule Take by mouth 2 (two) times daily.     No facility-administered medications prior to visit.     Allergies  Allergen Reactions  . Penicillins     Review of Systems  Constitutional: Positive for malaise/fatigue. Negative for chills and fever.  HENT: Positive for sore throat. Negative for sinus pain.   Respiratory: Negative for cough and sputum production.   Gastrointestinal: Negative for abdominal pain.  Genitourinary: Negative for dysuria.       Objective:    Physical Exam  Constitutional: He appears well-developed and well-nourished. No distress.  HENT:  Right Ear:  External ear normal.  Left Ear: External ear normal.  Nose: Nose normal.  Mouth/Throat: Oropharyngeal exudate present.  Eyes: Conjunctivae are normal.  Cardiovascular: Normal rate and regular rhythm.  Pulmonary/Chest: Effort normal and breath sounds normal.  Abdominal: Soft. Bowel sounds are normal.  Lymphadenopathy:    He has cervical adenopathy.  Psychiatric: He has a normal mood and affect.    BP 110/62   Pulse 80   Temp 98.9 F (37.2 C) (Oral)   Resp 18   Ht 6' 2.33" (1.888 m)   Wt 153 lb 9.6 oz (69.7 kg)   SpO2 99%   BMI 19.55 kg/m  Wt Readings from Last 3 Encounters:  12/17/18 153 lb 9.6 oz (69.7 kg)  08/07/18 161 lb 6.4 oz (73.2 kg)  06/10/17 150 lb (68 kg)    No results found for: TSH Lab Results  Component Value Date   WBC 7.5 08/07/2018   HGB 15.0 08/07/2018   HCT 44.4 08/07/2018   MCV 95 08/07/2018   PLT 213  08/07/2018   Lab Results  Component Value Date   NA 141 08/07/2018   K 4.2 08/07/2018   CO2 23 08/07/2018   GLUCOSE 82 08/07/2018   BUN 10 08/07/2018   CREATININE 0.84 08/07/2018   BILITOT 0.4 08/07/2018   ALKPHOS 82 08/07/2018   AST 29 08/07/2018   ALT 44 08/07/2018   PROT 7.3 08/07/2018   ALBUMIN 4.7 08/07/2018   CALCIUM 9.5 08/07/2018   Lab Results  Component Value Date   CHOL 124 08/07/2018   Lab Results  Component Value Date   HDL 67 08/07/2018   Lab Results  Component Value Date   LDLCALC 48 08/07/2018   Lab Results  Component Value Date   TRIG 44 08/07/2018   Lab Results  Component Value Date   CHOLHDL 1.9 08/07/2018   Lab Results  Component Value Date   HGBA1C 5.2 08/07/2018       Assessment & Plan:   Problem List Items Addressed This Visit    None    1. Acute tonsillitis, unspecified etiology - Culture, Group A Strep - POCT rapid strep A 2. Strep tonsillitis +strep D/w pt risk-precautions Recurrent infections-referral to ENT per pt request Beth Goodlin Hannah Beat, MD

## 2018-12-26 DIAGNOSIS — J353 Hypertrophy of tonsils with hypertrophy of adenoids: Secondary | ICD-10-CM | POA: Diagnosis not present

## 2018-12-26 DIAGNOSIS — J3503 Chronic tonsillitis and adenoiditis: Secondary | ICD-10-CM | POA: Diagnosis not present

## 2019-02-06 DIAGNOSIS — Z1159 Encounter for screening for other viral diseases: Secondary | ICD-10-CM | POA: Diagnosis not present

## 2019-02-12 DIAGNOSIS — J312 Chronic pharyngitis: Secondary | ICD-10-CM | POA: Diagnosis not present

## 2019-02-12 DIAGNOSIS — J3501 Chronic tonsillitis: Secondary | ICD-10-CM | POA: Diagnosis not present

## 2019-02-12 DIAGNOSIS — J353 Hypertrophy of tonsils with hypertrophy of adenoids: Secondary | ICD-10-CM | POA: Diagnosis not present

## 2019-02-12 DIAGNOSIS — J3503 Chronic tonsillitis and adenoiditis: Secondary | ICD-10-CM | POA: Diagnosis not present

## 2019-03-03 ENCOUNTER — Other Ambulatory Visit: Payer: Self-pay

## 2019-03-03 ENCOUNTER — Ambulatory Visit: Payer: BC Managed Care – PPO | Admitting: Emergency Medicine

## 2019-03-03 ENCOUNTER — Encounter: Payer: Self-pay | Admitting: Emergency Medicine

## 2019-03-03 VITALS — BP 103/65 | HR 63 | Temp 98.3°F | Resp 16 | Ht 72.0 in | Wt 153.8 lb

## 2019-03-03 DIAGNOSIS — Z23 Encounter for immunization: Secondary | ICD-10-CM | POA: Diagnosis not present

## 2019-03-03 DIAGNOSIS — E291 Testicular hypofunction: Secondary | ICD-10-CM

## 2019-03-03 NOTE — Progress Notes (Signed)
Jon Leach 23 y.o.   Chief Complaint  Patient presents with  . Erectile Dysfunction    per patient low libido x 1 month    HISTORY OF PRESENT ILLNESS: This is a 23 y.o. male with history of low testosterone for at least 2 to 3 years complaining of loss of libido with erectile dysfunction worse over the past 2 to 3 months.  States he had a normal puberty with normal erections and sex drive then. Non-smoker.  Denies drug use or EtOH abuse.  Physically active but not in excess.  No known chronic medical condition.  On no chronic medication.  No other associated symptoms.  HPI   Prior to Admission medications   Medication Sig Start Date End Date Taking? Authorizing Provider  calcium citrate-vitamin D (CITRACAL+D) 315-200 MG-UNIT per tablet Take 1 tablet by mouth 2 (two) times daily.    [provider]  clindamycin (CLEOCIN) 300 MG capsule Take 1 capsule (300 mg total) by mouth 3 (three) times daily. 12/17/18   Corum, Minerva Fester, MD  Multiple Vitamins-Minerals (MULTIVITAMIN PO) Take by mouth.    [provider]  omega-3 acid ethyl esters (LOVAZA) 1 G capsule Take by mouth 2 (two) times daily.    [provider]    Allergies  Allergen Reactions  . Penicillins     There are no active problems to display for this patient.   History reviewed. No pertinent past medical history.  Past Surgical History:  Procedure Laterality Date  . tooth     wisdome tooth removed    Social History   Socioeconomic History  . Marital status: Single    Spouse name: Not on file  . Number of children: 0  . Years of education: Not on file  . Highest education level: Not on file  Occupational History  . Not on file  Social Needs  . Financial resource strain: Not on file  . Food insecurity    Worry: Not on file    Inability: Not on file  . Transportation needs    Medical: Not on file    Non-medical: Not on file  Tobacco Use  . Smoking status: Never Smoker  . Smokeless  tobacco: Never Used  Substance and Sexual Activity  . Alcohol use: Yes    Comment: occ.   . Drug use: No  . Sexual activity: Yes  Lifestyle  . Physical activity    Days per week: Not on file    Minutes per session: Not on file  . Stress: Not on file  Relationships  . Social Musician on phone: Not on file    Gets together: Not on file    Attends religious service: Not on file    Active member of club or organization: Not on file    Attends meetings of clubs or organizations: Not on file    Relationship status: Not on file  . Intimate partner violence    Fear of current or ex partner: Not on file    Emotionally abused: Not on file    Physically abused: Not on file    Forced sexual activity: Not on file  Other Topics Concern  . Not on file  Social History Narrative  . Not on file    History reviewed. No pertinent family history.   Review of Systems  Constitutional: Negative.  Negative for chills and fever.  HENT: Negative for congestion and sore throat.   Respiratory: Negative.  Negative  for cough and shortness of breath.   Cardiovascular: Negative.  Negative for chest pain and palpitations.  Gastrointestinal: Negative.  Negative for abdominal pain, blood in stool, diarrhea, nausea and vomiting.  Genitourinary: Negative.  Negative for dysuria, frequency and hematuria.  Skin: Negative.  Negative for rash.  Neurological: Negative for dizziness and headaches.  All other systems reviewed and are negative.    Today's Vitals   03/03/19 1121  BP: 103/65  Pulse: 63  Resp: 16  Temp: 98.3 F (36.8 C)  TempSrc: Oral  SpO2: 100%  Weight: 153 lb 12.8 oz (69.8 kg)  Height: 6' (1.829 m)   Body mass index is 20.86 kg/m.   Physical Exam Vitals signs reviewed.  Constitutional:      Appearance: Normal appearance.  HENT:     Head: Normocephalic.  Eyes:     Extraocular Movements: Extraocular movements intact.     Pupils: Pupils are equal, round, and reactive  to light.  Neck:     Musculoskeletal: Normal range of motion.  Cardiovascular:     Rate and Rhythm: Normal rate and regular rhythm.     Heart sounds: Normal heart sounds.  Pulmonary:     Effort: Pulmonary effort is normal.     Breath sounds: Normal breath sounds.  Abdominal:     Palpations: Abdomen is soft.     Tenderness: There is no abdominal tenderness.  Musculoskeletal: Normal range of motion.  Skin:    General: Skin is warm and dry.  Neurological:     General: No focal deficit present.     Mental Status: He is alert and oriented to person, place, and time.  Psychiatric:        Mood and Affect: Mood normal.        Behavior: Behavior normal.      ASSESSMENT & PLAN: Jonny RuizJohn was seen today for erectile dysfunction.  Diagnoses and all orders for this visit:  Hypogonadism in male -     TestT+TestF+SHBG -     Ambulatory referral to Endocrinology  Need for prophylactic vaccination and inoculation against influenza -     Flu Vaccine QUAD 36+ mos IM    Patient Instructions       If you have lab work done today you will be contacted with your lab results within the next 2 weeks.  If you have not heard from us then please contact us. The fastest way to get your results is to register for My Chart.   IF you received an x-ray today, you will receive an invoice from [facility]. Please contact [facility] at [phone] with questions or concerns regarding your invoice.   IF you received labwork today, you will receive an invoice from [facility]. Please contact [facility] at [phone] with questions or concerns regarding your invoice.   Our billing staff will not be able to assist you with questions regarding bills from these companies.  You will be contacted with the lab results as soon as they are available. The fastest way to get your results is to activate your My Chart account. Instructions are located on the last page of this paperwork. If you have not  heard from us regarding the results in 2 weeks, please contact this office.     Testosterone Test Why am I having this test? Testosterone is a hormone made by the adrenal glands in the abdomen in both males and females. In males, it is also made by the testicles. Starting at puberty, testosterone stimulates the  development of secondary sex characteristics in males. This includes a deeper voice, muscle and body hair growth, and penis enlargement. In females, testosterone is also produced in the ovaries. The male body converts testosterone into estradiol, the main male sex hormone. An abnormal level of testosterone can cause health issues in both males and females. You may have this test if your health care provider suspects that an abnormal testosterone level is causing or contributing to other health problems. In males, an abnormally low testosterone level can cause:  Inability to have children (infertility).  Trouble getting or maintaining an erection (erectile dysfunction).  Delayed puberty. In females, an abnormally high testosterone level can cause:  Infertility.  Polycystic ovary syndrome (PCOS).  Development of masculine features (virilization). What is being tested? This test measures the amount of total testosterone in your blood. What kind of sample is taken?  A blood sample is required for this test. It is usually collected by inserting a needle into a blood vessel. The sample is most often collected in the morning because that is when testosterone is usually the highest. Tell a health care provider about:  Any allergies you have.  All medicines you are taking, including vitamins, herbs, eye drops, creams, and over-the-counter medicines.  Any blood disorders you have.  Any surgeries you have had.  Any medical conditions you have.  Whether you are pregnant or may be pregnant. How are the results reported? Your test results will be reported as a value that  indicates how much testosterone is in your blood. This will be given as nanograms of testosterone per deciliter of blood (ng/dL). Your health care provider will compare your test results to normal ranges that were established after testing a large group of people (reference ranges). Reference ranges may vary among labs and hospitals. For this test, common reference ranges for total testosterone are:  Male: ? 7 months to 23 years old: less than 30 ng/dL. ? 1-10 years old: less than 300 ng/dL. ? 16-45 years old: 170-540 ng/dL. ? 27-68 years old: 250-910 ng/dL. ? 20 years and older: 280-1,080 ng/dL.  Male: ? 7 months to 23 years old: less than 30 ng/dL. ? 49-18 years old: less than 40 ng/dL. ? 45-80 years old: less than 60 ng/dL. ? 59-27 years old: less than 70 ng/dL. ? 20 years and older: less than 70 ng/dL. What do the results mean? A result that is within your reference range means that you have a normal amount of testosterone in your blood. In males:  A high testosterone level may mean that you: ? Have certain types of tumors. ? Have an overactive thyroid gland (hyperthyroidism). ? Currently use anabolic steroids or used anabolic steroids in the past. ? Have an inherited disorder that affects the adrenal glands (congenital adrenal hyperplasia). ? Are starting puberty early (precocious puberty).  A low testosterone level may mean that you: ? Have certain genetic diseases. ? Have had certain viral infections, such as mumps. ? Have a condition that affects the pituitary gland. ? Have injured your testicles. In females:  A high testosterone level may mean that you have: ? Certain types of tumors, such as ovarian or adrenal gland tumors. ? An inherited disorder that affects certain cells in the adrenal glands (congenital adrenal hyperplasia). ? Polycystic ovary syndrome.  A low testosterone level usually will not cause health problems. Talk with your health care provider about  what your results mean. Questions to ask your health care provider Ask your health  care provider, or the department that is doing the test:  When will my results be ready?  How will I get my results?  What are my treatment options?  What other tests do I need?  What are my next steps? Summary  Testosterone is a hormone made by the adrenal glands in the abdomen in both males and females. In males, it is also made by the testicles. Starting at puberty, testosterone stimulates the development of secondary sex characteristics in males.  In females, testosterone is also produced in the ovaries. The male body converts testosterone into estradiol, the main male sex hormone.  An abnormal level of testosterone can cause health issues in both males and females. You may have this test if your health care provider suspects that an abnormal testosterone level is causing or contributing to other health problems. This information is not intended to replace advice given to you by your health care provider. Make sure you discuss any questions you have with your health care provider. Document Released: 07/05/2004 Document Revised: 03/19/2017 Document Reviewed: 03/19/2017 Elsevier Patient Education  2020 Elsevier Inc.      Edwina BarthMiguel Zander Ingham, MD [facility]

## 2019-03-03 NOTE — Patient Instructions (Addendum)
If you have lab work done today you will be contacted with your lab results within the next 2 weeks.  If you have not heard from [place] then please contact us. The fastest way to get your results is to register for My Chart.   IF you received an x-ray today, you will receive an invoice from [facility]. Please contact [facility] at [phone] with questions or concerns regarding your invoice.   IF you received labwork today, you will receive an invoice from [facility]. Please contact [facility] at [phone] with questions or concerns regarding your invoice.   Our billing staff will not be able to assist you with questions regarding bills from these companies.  You will be contacted with the lab results as soon as they are available. The fastest way to get your results is to activate your My Chart account. Instructions are located on the last page of this paperwork. If you have not heard from [place] regarding the results in 2 weeks, please contact this office.     Testosterone Test Why am I having this test? Testosterone is a hormone made by the adrenal glands in the abdomen in both males and females. In males, it is also made by the testicles. Starting at puberty, testosterone stimulates the development of secondary sex characteristics in males. This includes a deeper voice, muscle and body hair growth, and penis enlargement. In females, testosterone is also produced in the ovaries. The male body converts testosterone into estradiol, the main male sex hormone. An abnormal level of testosterone can cause health issues in both males and females. You may have this test if your health care provider suspects that an abnormal testosterone level is causing or contributing to other health problems. In males, an abnormally low testosterone level can cause:  Inability to have children (infertility).  Trouble getting or maintaining an erection (erectile dysfunction).  Delayed  puberty. In females, an abnormally high testosterone level can cause:  Infertility.  Polycystic ovary syndrome (PCOS).  Development of masculine features (virilization). What is being tested? This test measures the amount of total testosterone in your blood. What kind of sample is taken?  A blood sample is required for this test. It is usually collected by inserting a needle into a blood vessel. The sample is most often collected in the morning because that is when testosterone is usually the highest. Tell a health care provider about:  Any allergies you have.  All medicines you are taking, including vitamins, herbs, eye drops, creams, and over-the-counter medicines.  Any blood disorders you have.  Any surgeries you have had.  Any medical conditions you have.  Whether you are pregnant or may be pregnant. How are the results reported? Your test results will be reported as a value that indicates how much testosterone is in your blood. This will be given as nanograms of testosterone per deciliter of blood (ng/dL). Your health care provider will compare your test results to normal ranges that were established after testing a large group of people (reference ranges). Reference ranges may vary among labs and hospitals. For this test, common reference ranges for total testosterone are:  Male: ? 7 months to 23 years old: less than 30 ng/dL. ? 57-26 years old: less than 300 ng/dL. ? 75-44 years old: 170-540 ng/dL. ? 65-62 years old: 250-910 ng/dL. ? 20 years and older: 280-1,080 ng/dL.  Male: ? 7 months to 23 years old: less than 30 ng/dL. ? 76-12 years old: less  than 40 ng/dL. ? 2314-23 years old: less than 60 ng/dL. ? 1416-244 years old: less than 70 ng/dL. ? 20 years and older: less than 70 ng/dL. What do the results mean? A result that is within your reference range means that you have a normal amount of testosterone in your blood. In males:  A high testosterone level may mean  that you: ? Have certain types of tumors. ? Have an overactive thyroid gland (hyperthyroidism). ? Currently use anabolic steroids or used anabolic steroids in the past. ? Have an inherited disorder that affects the adrenal glands (congenital adrenal hyperplasia). ? Are starting puberty early (precocious puberty).  A low testosterone level may mean that you: ? Have certain genetic diseases. ? Have had certain viral infections, such as mumps. ? Have a condition that affects the pituitary gland. ? Have injured your testicles. In females:  A high testosterone level may mean that you have: ? Certain types of tumors, such as ovarian or adrenal gland tumors. ? An inherited disorder that affects certain cells in the adrenal glands (congenital adrenal hyperplasia). ? Polycystic ovary syndrome.  A low testosterone level usually will not cause health problems. Talk with your health care provider about what your results mean. Questions to ask your health care provider Ask your health care provider, or the department that is doing the test:  When will my results be ready?  How will I get my results?  What are my treatment options?  What other tests do I need?  What are my next steps? Summary  Testosterone is a hormone made by the adrenal glands in the abdomen in both males and females. In males, it is also made by the testicles. Starting at puberty, testosterone stimulates the development of secondary sex characteristics in males.  In females, testosterone is also produced in the ovaries. The male body converts testosterone into estradiol, the main male sex hormone.  An abnormal level of testosterone can cause health issues in both males and females. You may have this test if your health care provider suspects that an abnormal testosterone level is causing or contributing to other health problems. This information is not intended to replace advice given to you by your health care  provider. Make sure you discuss any questions you have with your health care provider. Document Released: 07/05/2004 Document Revised: 03/19/2017 Document Reviewed: 03/19/2017 Elsevier Patient Education  2020 [employer]

## 2019-03-06 LAB — TESTT+TESTF+SHBG
Sex Hormone Binding: 34.9 nmol/L (ref 16.5–55.9)
Testosterone, Free: 9.8 pg/mL (ref 9.3–26.5)
Testosterone, Total, LC/MS: 505.4 ng/dL (ref 264.0–916.0)

## 2019-03-21 ENCOUNTER — Encounter: Payer: Self-pay | Admitting: Emergency Medicine

## 2019-06-24 DIAGNOSIS — M9905 Segmental and somatic dysfunction of pelvic region: Secondary | ICD-10-CM | POA: Diagnosis not present

## 2019-06-24 DIAGNOSIS — M9901 Segmental and somatic dysfunction of cervical region: Secondary | ICD-10-CM | POA: Diagnosis not present

## 2019-06-24 DIAGNOSIS — M9903 Segmental and somatic dysfunction of lumbar region: Secondary | ICD-10-CM | POA: Diagnosis not present

## 2019-06-24 DIAGNOSIS — M9902 Segmental and somatic dysfunction of thoracic region: Secondary | ICD-10-CM | POA: Diagnosis not present

## 2019-07-13 DIAGNOSIS — R269 Unspecified abnormalities of gait and mobility: Secondary | ICD-10-CM | POA: Diagnosis not present

## 2019-07-13 DIAGNOSIS — M545 Low back pain: Secondary | ICD-10-CM | POA: Diagnosis not present

## 2019-07-27 DIAGNOSIS — R269 Unspecified abnormalities of gait and mobility: Secondary | ICD-10-CM | POA: Diagnosis not present

## 2019-07-27 DIAGNOSIS — M545 Low back pain: Secondary | ICD-10-CM | POA: Diagnosis not present

## 2019-11-10 DIAGNOSIS — M25562 Pain in left knee: Secondary | ICD-10-CM | POA: Diagnosis not present

## 2019-11-10 DIAGNOSIS — M545 Low back pain: Secondary | ICD-10-CM | POA: Diagnosis not present

## 2019-11-10 DIAGNOSIS — R269 Unspecified abnormalities of gait and mobility: Secondary | ICD-10-CM | POA: Diagnosis not present

## 2019-11-10 DIAGNOSIS — M79605 Pain in left leg: Secondary | ICD-10-CM | POA: Diagnosis not present

## 2019-11-17 DIAGNOSIS — M25561 Pain in right knee: Secondary | ICD-10-CM | POA: Diagnosis not present

## 2019-11-17 DIAGNOSIS — M25562 Pain in left knee: Secondary | ICD-10-CM | POA: Diagnosis not present

## 2019-11-17 DIAGNOSIS — M79605 Pain in left leg: Secondary | ICD-10-CM | POA: Diagnosis not present

## 2019-11-17 DIAGNOSIS — R269 Unspecified abnormalities of gait and mobility: Secondary | ICD-10-CM | POA: Diagnosis not present

## 2019-11-18 DIAGNOSIS — Z792 Long term (current) use of antibiotics: Secondary | ICD-10-CM | POA: Diagnosis not present

## 2019-11-18 DIAGNOSIS — S51852A Open bite of left forearm, initial encounter: Secondary | ICD-10-CM | POA: Diagnosis not present

## 2019-11-18 DIAGNOSIS — W540XXA Bitten by dog, initial encounter: Secondary | ICD-10-CM | POA: Diagnosis not present

## 2019-11-18 DIAGNOSIS — T148XXA Other injury of unspecified body region, initial encounter: Secondary | ICD-10-CM | POA: Diagnosis not present

## 2019-11-24 DIAGNOSIS — M25561 Pain in right knee: Secondary | ICD-10-CM | POA: Diagnosis not present

## 2019-11-24 DIAGNOSIS — M25562 Pain in left knee: Secondary | ICD-10-CM | POA: Diagnosis not present

## 2019-11-24 DIAGNOSIS — M79605 Pain in left leg: Secondary | ICD-10-CM | POA: Diagnosis not present

## 2019-11-24 DIAGNOSIS — R269 Unspecified abnormalities of gait and mobility: Secondary | ICD-10-CM | POA: Diagnosis not present

## 2019-12-03 DIAGNOSIS — M79605 Pain in left leg: Secondary | ICD-10-CM | POA: Diagnosis not present

## 2019-12-03 DIAGNOSIS — M25561 Pain in right knee: Secondary | ICD-10-CM | POA: Diagnosis not present

## 2019-12-03 DIAGNOSIS — R269 Unspecified abnormalities of gait and mobility: Secondary | ICD-10-CM | POA: Diagnosis not present

## 2019-12-03 DIAGNOSIS — M25562 Pain in left knee: Secondary | ICD-10-CM | POA: Diagnosis not present

## 2019-12-22 DIAGNOSIS — M25561 Pain in right knee: Secondary | ICD-10-CM | POA: Diagnosis not present

## 2019-12-22 DIAGNOSIS — R269 Unspecified abnormalities of gait and mobility: Secondary | ICD-10-CM | POA: Diagnosis not present

## 2019-12-22 DIAGNOSIS — M79605 Pain in left leg: Secondary | ICD-10-CM | POA: Diagnosis not present

## 2019-12-22 DIAGNOSIS — M25562 Pain in left knee: Secondary | ICD-10-CM | POA: Diagnosis not present

## 2020-01-05 DIAGNOSIS — R269 Unspecified abnormalities of gait and mobility: Secondary | ICD-10-CM | POA: Diagnosis not present

## 2020-01-05 DIAGNOSIS — M25562 Pain in left knee: Secondary | ICD-10-CM | POA: Diagnosis not present

## 2020-01-05 DIAGNOSIS — M25561 Pain in right knee: Secondary | ICD-10-CM | POA: Diagnosis not present

## 2020-01-05 DIAGNOSIS — M79605 Pain in left leg: Secondary | ICD-10-CM | POA: Diagnosis not present

## 2020-01-11 DIAGNOSIS — M79605 Pain in left leg: Secondary | ICD-10-CM | POA: Diagnosis not present

## 2020-01-11 DIAGNOSIS — M25562 Pain in left knee: Secondary | ICD-10-CM | POA: Diagnosis not present

## 2020-01-11 DIAGNOSIS — M25561 Pain in right knee: Secondary | ICD-10-CM | POA: Diagnosis not present

## 2020-01-11 DIAGNOSIS — R269 Unspecified abnormalities of gait and mobility: Secondary | ICD-10-CM | POA: Diagnosis not present

## 2020-01-22 DIAGNOSIS — R6882 Decreased libido: Secondary | ICD-10-CM | POA: Diagnosis not present

## 2020-02-11 ENCOUNTER — Encounter: Payer: Self-pay | Admitting: Family Medicine

## 2020-02-11 ENCOUNTER — Ambulatory Visit (INDEPENDENT_AMBULATORY_CARE_PROVIDER_SITE_OTHER): Payer: BC Managed Care – PPO

## 2020-02-11 ENCOUNTER — Other Ambulatory Visit: Payer: Self-pay

## 2020-02-11 ENCOUNTER — Ambulatory Visit (INDEPENDENT_AMBULATORY_CARE_PROVIDER_SITE_OTHER): Payer: BC Managed Care – PPO | Admitting: Family Medicine

## 2020-02-11 VITALS — BP 102/63 | HR 80 | Temp 98.4°F | Resp 15 | Ht 72.0 in | Wt 172.4 lb

## 2020-02-11 DIAGNOSIS — M79672 Pain in left foot: Secondary | ICD-10-CM

## 2020-02-11 DIAGNOSIS — M79671 Pain in right foot: Secondary | ICD-10-CM

## 2020-02-11 DIAGNOSIS — M2012 Hallux valgus (acquired), left foot: Secondary | ICD-10-CM | POA: Diagnosis not present

## 2020-02-11 NOTE — Progress Notes (Signed)
Patient ID: Jon Leach, male    DOB: 04/10/1996  Age: 24 y.o. MRN: [id number]  Chief Complaint  Patient presents with  . Foot Pain    pt has had Rt sided foot pain x3weeks pt had an old stress fracture in the same place and this is the same type of pain but notes last fracture was 4+ years ago when he was running consistently     Subjective:   24 year old man with history of right foot pain where he had an old stress fracture 4 years ago.  He also has similar pain on the left foot.  No specific injury except for it started after playing badminton and jumping a lot about 3 weeks ago.  It is continued to persist.  He did do a little activity since then but it hurts.  In the past he had a stress fracture which took a long time healing.  Bone density was okay.  He wonders why he gets these things.  He saw an orthopedist at [facility] orthopedics back then.  Current allergies, medications, problem list, past/family and social histories reviewed.  Objective:  BP 102/63   Pulse 80   Temp 98.4 F (36.9 C) (Temporal)   Resp 15   Ht 6' (1.829 m)   Wt 172 lb 6.4 oz (78.2 kg)   SpO2 98%   BMI 23.38 kg/m   No major acute distress.  He has some tenderness mid to distal shaft of both the right and left third metatarsals and a little bit on the distal left fourth metatarsal.  He is very flat-footed.  Assessment & Plan:   Assessment: 1. Foot pain, bilateral       Plan: X-ray both feet negative  Orders Placed This Encounter  Procedures  . DG Foot Complete Left    Standing Status:   Future    Number of Occurrences:   1    Standing Expiration Date:   02/10/2021    Order Specific Question:   Reason for Exam (SYMPTOM  OR DIAGNOSIS REQUIRED)    Answer:   right 3rd metatarsal pain, left 3rd and 4th metatarsal pain.  History of prior stress fractures.    Order Specific Question:   Preferred imaging location?    Answer:   External  . DG Foot Complete Right    Standing Status:   Future     Number of Occurrences:   1    Standing Expiration Date:   02/10/2021    Order Specific Question:   Reason for Exam (SYMPTOM  OR DIAGNOSIS REQUIRED)    Answer:   right 3rd metatarsal pain, left 3rd and 4th metatarsal pain.  History of prior stress fractures.    Order Specific Question:   Preferred imaging location?    Answer:   External  . Ambulatory referral to Orthopedic Surgery    Referral Priority:   Routine    Referral Type:   Surgical    Referral Reason:   Specialty Services Required    Referred to Provider:   Toni Arthurs, MD    Requested Specialty:   Orthopedic Surgery    Number of Visits Requested:   1    No orders of the defined types were placed in this encounter.        Patient Instructions    You will be referred to orthopedics.  I am requesting Dr. Toni Arthurs at [facility].  In the meanwhile I would advise being gentle on your feet,  not doing a lot of jumping or running.  One question you might ask him is whether orthotics would be beneficial or not.  It is my opinion that your flat feet may be contributing to your issues.  Return as needed   If you have lab work done today you will be contacted with your lab results within the next 2 weeks.  If you have not heard from [place] then please contact us. The fastest way to get your results is to register for My Chart.   IF you received an x-ray today, you will receive an invoice from [facility]. Please contact [facility] at [phone] with questions or concerns regarding your invoice.   IF you received labwork today, you will receive an invoice from [facility]. Please contact [facility] at [phone] with questions or concerns regarding your invoice.   Our billing staff will not be able to assist you with questions regarding bills from these companies.  You will be contacted with the lab results as soon as they are available. The fastest way to get your results is to  activate your My Chart account. Instructions are located on the last page of this paperwork. If you have not heard from [place] regarding the results in 2 weeks, please contact this office.        Return if symptoms worsen or fail to improve.   Janace Hoard, MD 02/11/2020

## 2020-02-11 NOTE — Patient Instructions (Addendum)
  You will be referred to orthopedics.  I am requesting Dr. Toni Arthurs at [facility] orthoped[facility].  In the meanwhile I would advise being gentle on your feet, not doing a lot of jumping or running.  One question you might ask him is whether orthotics would be beneficial or not.  It is my opinion that your flat feet may be contributing to your issues.  Return as needed   If you have lab work done today you will be contacted with your lab results within the next 2 weeks.  If you have not heard from [place] then please contact us. The fastest way to get your results is to register for My Chart.   IF you received an x-ray today, you will receive an invoice from [facility]. Please contact [facility] at [phone] with questions or concerns regarding your invoice.   IF you received labwork today, you will receive an invoice from [facility]. Please contact [facility] at [phone] with questions or concerns regarding your invoice.   Our billing staff will not be able to assist you with questions regarding bills from these companies.  You will be contacted with the lab results as soon as they are available. The fastest way to get your results is to activate your My Chart account. Instructions are located on the last page of this paperwork. If you have not heard from [place] regarding the results in 2 weeks, please contact this office.

## 2020-02-15 DIAGNOSIS — M79672 Pain in left foot: Secondary | ICD-10-CM | POA: Diagnosis not present

## 2020-02-15 DIAGNOSIS — M79671 Pain in right foot: Secondary | ICD-10-CM | POA: Diagnosis not present

## 2020-03-16 DIAGNOSIS — M25561 Pain in right knee: Secondary | ICD-10-CM | POA: Diagnosis not present

## 2020-03-16 DIAGNOSIS — M25562 Pain in left knee: Secondary | ICD-10-CM | POA: Diagnosis not present

## 2020-03-16 DIAGNOSIS — R269 Unspecified abnormalities of gait and mobility: Secondary | ICD-10-CM | POA: Diagnosis not present

## 2020-03-16 DIAGNOSIS — M79605 Pain in left leg: Secondary | ICD-10-CM | POA: Diagnosis not present

## 2020-04-04 DIAGNOSIS — M25562 Pain in left knee: Secondary | ICD-10-CM | POA: Diagnosis not present

## 2020-04-04 DIAGNOSIS — M25561 Pain in right knee: Secondary | ICD-10-CM | POA: Diagnosis not present

## 2020-04-04 DIAGNOSIS — M79605 Pain in left leg: Secondary | ICD-10-CM | POA: Diagnosis not present

## 2020-04-04 DIAGNOSIS — R269 Unspecified abnormalities of gait and mobility: Secondary | ICD-10-CM | POA: Diagnosis not present

## 2020-04-13 DIAGNOSIS — M25521 Pain in right elbow: Secondary | ICD-10-CM | POA: Diagnosis not present

## 2020-04-13 DIAGNOSIS — M549 Dorsalgia, unspecified: Secondary | ICD-10-CM | POA: Diagnosis not present

## 2020-05-04 DIAGNOSIS — J3089 Other allergic rhinitis: Secondary | ICD-10-CM | POA: Diagnosis not present

## 2020-05-04 DIAGNOSIS — J3081 Allergic rhinitis due to animal (cat) (dog) hair and dander: Secondary | ICD-10-CM | POA: Diagnosis not present

## 2020-05-04 DIAGNOSIS — J309 Allergic rhinitis, unspecified: Secondary | ICD-10-CM | POA: Diagnosis not present

## 2020-05-04 DIAGNOSIS — J301 Allergic rhinitis due to pollen: Secondary | ICD-10-CM | POA: Diagnosis not present

## 2020-05-13 DIAGNOSIS — M549 Dorsalgia, unspecified: Secondary | ICD-10-CM | POA: Diagnosis not present

## 2020-05-13 DIAGNOSIS — M25521 Pain in right elbow: Secondary | ICD-10-CM | POA: Diagnosis not present

## 2020-05-13 DIAGNOSIS — J301 Allergic rhinitis due to pollen: Secondary | ICD-10-CM | POA: Diagnosis not present

## 2020-05-16 DIAGNOSIS — J3089 Other allergic rhinitis: Secondary | ICD-10-CM | POA: Diagnosis not present

## 2020-05-16 DIAGNOSIS — J3081 Allergic rhinitis due to animal (cat) (dog) hair and dander: Secondary | ICD-10-CM | POA: Diagnosis not present

## 2020-05-25 DIAGNOSIS — K641 Second degree hemorrhoids: Secondary | ICD-10-CM | POA: Diagnosis not present

## 2020-06-08 DIAGNOSIS — J3081 Allergic rhinitis due to animal (cat) (dog) hair and dander: Secondary | ICD-10-CM | POA: Diagnosis not present

## 2020-06-08 DIAGNOSIS — K641 Second degree hemorrhoids: Secondary | ICD-10-CM | POA: Diagnosis not present

## 2020-06-08 DIAGNOSIS — J3089 Other allergic rhinitis: Secondary | ICD-10-CM | POA: Diagnosis not present

## 2020-06-08 DIAGNOSIS — J301 Allergic rhinitis due to pollen: Secondary | ICD-10-CM | POA: Diagnosis not present

## 2020-06-13 DIAGNOSIS — J3089 Other allergic rhinitis: Secondary | ICD-10-CM | POA: Diagnosis not present

## 2020-06-13 DIAGNOSIS — J301 Allergic rhinitis due to pollen: Secondary | ICD-10-CM | POA: Diagnosis not present

## 2020-06-13 DIAGNOSIS — J3081 Allergic rhinitis due to animal (cat) (dog) hair and dander: Secondary | ICD-10-CM | POA: Diagnosis not present

## 2020-06-22 DIAGNOSIS — J301 Allergic rhinitis due to pollen: Secondary | ICD-10-CM | POA: Diagnosis not present

## 2020-06-22 DIAGNOSIS — J3089 Other allergic rhinitis: Secondary | ICD-10-CM | POA: Diagnosis not present

## 2020-06-22 DIAGNOSIS — J3081 Allergic rhinitis due to animal (cat) (dog) hair and dander: Secondary | ICD-10-CM | POA: Diagnosis not present

## 2020-06-27 DIAGNOSIS — K641 Second degree hemorrhoids: Secondary | ICD-10-CM | POA: Diagnosis not present

## 2020-07-13 ENCOUNTER — Encounter: Payer: Self-pay | Admitting: Emergency Medicine

## 2020-07-13 ENCOUNTER — Telehealth (INDEPENDENT_AMBULATORY_CARE_PROVIDER_SITE_OTHER): Payer: 59 | Admitting: Emergency Medicine

## 2020-07-13 ENCOUNTER — Other Ambulatory Visit: Payer: Self-pay

## 2020-07-13 VITALS — Ht 73.0 in | Wt 180.0 lb

## 2020-07-13 DIAGNOSIS — U071 COVID-19: Secondary | ICD-10-CM | POA: Diagnosis not present

## 2020-07-13 DIAGNOSIS — R059 Cough, unspecified: Secondary | ICD-10-CM | POA: Diagnosis not present

## 2020-07-13 DIAGNOSIS — J22 Unspecified acute lower respiratory infection: Secondary | ICD-10-CM | POA: Diagnosis not present

## 2020-07-13 MED ORDER — AZITHROMYCIN 250 MG PO TABS: ORAL_TABLET | ORAL | 0 refills | Status: DC

## 2020-07-13 MED ORDER — HYDROCODONE-HOMATROPINE 5-1.5 MG/5ML PO SYRP
5.0000 mL | ORAL_SOLUTION | Freq: Every evening | ORAL | 0 refills | Status: DC | PRN
Start: 2020-07-13 — End: 2020-07-14

## 2020-07-13 NOTE — Progress Notes (Signed)
Telemedicine Encounter- SOAP NOTE Established Patient Patient: Home  Provider: Office     This telephone encounter was conducted with the patient's (or proxy's) verbal consent via audio telecommunications: yes/no: Yes Patient was instructed to have this encounter in a suitably private space; and to only have persons present to whom they give permission to participate. In addition, patient identity was confirmed by use of name plus two identifiers (DOB and address).  I discussed the limitations, risks, security and privacy concerns of performing an evaluation and management service by telephone and the availability of in person appointments. I also discussed with the patient that there may be a patient responsible charge related to this service. The patient expressed understanding and agreed to proceed.  I spent a total of TIME; 0 MIN TO 60 MIN: 20 minutes talking with the patient or their proxy.  Chief Complaint  Patient presents with  . Cough    Per patient cough is dry and tested for Covid at PCR with negative and home test 07/03/20 positive results    Subjective   SOLOMAN MCKEITHAN is a 25 y.o. male established patient. Telephone visit today complaining of flulike symptoms that started on June 30, 2020 and have been progressively getting better.  However 1 week ago he developed a productive cough that seems to be getting progressively worse keeping him from sleeping at night.  Denies difficulty breathing or chest pain.  Able to eat and drink.  Denies nausea or vomiting.  Denies any other associated significant symptomatology. No other complaints or medical concerns today.  HPI   There are no problems to display for this patient.   History reviewed. No pertinent past medical history.  Current Outpatient Medications  Medication Sig Dispense Refill  . Dextromethorphan Polistirex (DELSYM PO) Take by mouth as needed.     No current facility-administered medications for this visit.     Allergies  Allergen Reactions  . Penicillins     Social History   Socioeconomic History  . Marital status: Single    Spouse name: Not on file  . Number of children: 0  . Years of education: Not on file  . Highest education level: Not on file  Occupational History  . Not on file  Tobacco Use  . Smoking status: Never Smoker  . Smokeless tobacco: Never Used  Vaping Use  . Vaping Use: Never used  Substance and Sexual Activity  . Alcohol use: Yes    Comment: occ.   . Drug use: No  . Sexual activity: Yes  Other Topics Concern  . Not on file  Social History Narrative  . Not on file   Social Determinants of Health   Financial Resource Strain: Not on file  Food Insecurity: Not on file  Transportation Needs: Not on file  Physical Activity: Not on file  Stress: Not on file  Social Connections: Not on file  Intimate Partner Violence: Not on file    Review of Systems  Constitutional: Positive for fever. Negative for chills.  HENT: Positive for congestion and sore throat.   Respiratory: Positive for cough and sputum production. Negative for shortness of breath and wheezing.   Cardiovascular: Negative.  Negative for chest pain and palpitations.  Gastrointestinal: Negative.  Negative for abdominal pain, diarrhea, nausea and vomiting.  Genitourinary: Negative.  Negative for dysuria.  Musculoskeletal: Negative.  Negative for back pain, myalgias and neck pain.  Skin: Negative.  Negative for rash.  Neurological: Negative.  Negative for dizziness and  headaches.  All other systems reviewed and are negative.   Objective  Alert and oriented x3 in no apparent respiratory distress. Vitals as reported by the patient: Today's Vitals   07/13/20 1639  Weight: 180 lb (81.6 kg)  Height: 6\' 1"  (1.854 m)    There are no diagnoses linked to this encounter. Clinically stable.  No red flag signs or symptoms. COVID advice given.  ED precautions given. May have a secondary bacterial  lower respiratory infection.  Will treat with antibiotics. Cough medicine for bedtime. Advised to call the office if no better or worse during the next several days. Apostolos was seen today for cough.  Diagnoses and all orders for this visit:  COVID-19 virus infection  Cough -     HYDROcodone-homatropine (HYCODAN) 5-1.5 MG/5ML syrup; Take 5 mLs by mouth at bedtime as needed for cough.  Lower respiratory infection -     azithromycin (ZITHROMAX) 250 MG tablet; Sig as indicated     I discussed the assessment and treatment plan with the patient. The patient was provided an opportunity to ask questions and all were answered. The patient agreed with the plan and demonstrated an understanding of the instructions.   The patient was advised to call back or seek an in-person evaluation if the symptoms worsen or if the condition fails to improve as anticipated.  I provided 20 minutes of non-face-to-face time during this encounter.  , MD  [facility] at [facility]

## 2020-07-13 NOTE — Patient Instructions (Signed)
° ° ° °  If you have lab work done today you will be contacted with your lab results within the next 2 weeks.  If you have not heard from us then please contact us. The fastest way to get your results is to register for My Chart. ° ° °IF you received an x-ray today, you will receive an invoice from [facility]. Please contact [facility] at [phone] with questions or concerns regarding your invoice.  ° °IF you received labwork today, you will receive an invoice from [facility]. Please contact [facility] at [phone] with questions or concerns regarding your invoice.  ° °Our billing staff will not be able to assist you with questions regarding bills from these companies. ° °You will be contacted with the lab results as soon as they are available. The fastest way to get your results is to activate your My Chart account. Instructions are located on the last page of this paperwork. If you have not heard from us regarding the results in 2 weeks, please contact this office. °  ° ° ° °

## 2020-07-14 ENCOUNTER — Other Ambulatory Visit: Payer: Self-pay | Admitting: Emergency Medicine

## 2020-07-14 DIAGNOSIS — R059 Cough, unspecified: Secondary | ICD-10-CM

## 2020-07-14 MED ORDER — PROMETHAZINE-DM 6.25-15 MG/5ML PO SYRP: 5.0000 mL | ORAL_SOLUTION | Freq: Four times a day (QID) | ORAL | 1 refills | Status: DC | PRN

## 2020-07-14 NOTE — Telephone Encounter (Signed)
New prescription sent in to pharmacy of record.  Thanks.

## 2020-07-16 ENCOUNTER — Other Ambulatory Visit: Payer: Self-pay | Admitting: Registered Nurse

## 2020-07-16 DIAGNOSIS — R059 Cough, unspecified: Secondary | ICD-10-CM

## 2020-07-16 MED ORDER — HYDROCODONE-HOMATROPINE 5-1.5 MG/5ML PO SYRP: 5.0000 mL | ORAL_SOLUTION | Freq: Three times a day (TID) | ORAL | 0 refills | Status: DC | PRN

## 2020-07-18 ENCOUNTER — Other Ambulatory Visit: Payer: Self-pay | Admitting: Emergency Medicine

## 2020-07-18 DIAGNOSIS — R059 Cough, unspecified: Secondary | ICD-10-CM

## 2020-07-18 MED ORDER — HYDROCODONE-HOMATROPINE 5-1.5 MG/5ML PO SYRP: 5.0000 mL | ORAL_SOLUTION | Freq: Three times a day (TID) | ORAL | 0 refills | Status: DC | PRN

## 2020-07-18 NOTE — Telephone Encounter (Signed)
These are both on back order at multiple pharmacy I tried for the area is there an alternative he can be given?

## 2020-08-08 ENCOUNTER — Other Ambulatory Visit: Payer: Self-pay

## 2020-08-08 ENCOUNTER — Emergency Department (HOSPITAL_COMMUNITY)
Admission: EM | Admit: 2020-08-08 | Discharge: 2020-08-08 | Disposition: A | Discharge status: Home / Self Care | Payer: 59 | Attending: Emergency Medicine | Admitting: Emergency Medicine

## 2020-08-08 ENCOUNTER — Encounter (HOSPITAL_COMMUNITY): Payer: Self-pay

## 2020-08-08 DIAGNOSIS — G9389 Other specified disorders of brain: Secondary | ICD-10-CM | POA: Diagnosis not present

## 2020-08-08 DIAGNOSIS — R0602 Shortness of breath: Secondary | ICD-10-CM | POA: Diagnosis not present

## 2020-08-08 DIAGNOSIS — R42 Dizziness and giddiness: Secondary | ICD-10-CM | POA: Insufficient documentation

## 2020-08-08 DIAGNOSIS — T7840XA Allergy, unspecified, initial encounter: Secondary | ICD-10-CM | POA: Diagnosis not present

## 2020-08-08 NOTE — ED Provider Notes (Signed)
[facility]   CSN: [id number] Arrival date & time: 08/08/20  1836     History Chief Complaint  Patient presents with  . Allergic Reaction    Jon Leach is a 25 y.o. male with a history of allergic rhinitis.  Patient presents with a chief complaint of allergic reaction.  Patient reports that he has been receiving immunotherapy injections for his seasonal allergies.  He states he has been receiving these injections 2-3 times a week over the last 2 and half months.  He states that over this time slowly been increasing the strength of these injections.  Patient states that today approximately 1650 received his immunotherapy injection tolerated well.  Patient states that around 1730 walking around and shopping he started to feel dizzy, short of breath, and developed a "brain fog."  Patient symptoms continued for the next 30 minutes.  Patient reports that he contacted his allergist to advise him to use his EpiPen.  Patient ports using his EpiPen at 1810.  Reports that his symptoms have been progressively resolving since then.  Reports that he feels back to baseline present.    Patient denies any shortness of breath, difficulty swallowing, rash, palpitations  HPI     History reviewed. No pertinent past medical history.  There are no problems to display for this patient.   Past Surgical History:  Procedure Laterality Date  . EYE SURGERY    . TONSILLECTOMY    . tooth     wisdome tooth removed       Family History  Problem Relation Age of Onset  . Healthy Mother   . Healthy Father     Social History   Tobacco Use  . Smoking status: Never Smoker  . Smokeless tobacco: Never Used  Vaping Use  . Vaping Use: Never used  Substance Use Topics  . Alcohol use: Yes    Comment: occ.   . Drug use: No    Home Medications Prior to Admission medications   Medication Sig Start Date End Date Taking? Authorizing Provider   promethazine-dextromethorphan (PROMETHAZINE-DM) 6.25-15 MG/5ML syrup Take 5 mLs by mouth 4 (four) times daily as needed for cough. 07/14/20   Georgina Quint, MD  azithromycin [facility]) 250 MG tablet Sig as indicated 07/13/20   Georgina Quint, MD  HYDROcodone-homatropine [facility]) 5-1.5 MG/5ML syrup Take 5 mLs by mouth every 8 (eight) hours as needed for cough. 07/18/20   Georgina Quint, MD    Allergies    Penicillins  Review of Systems   Review of Systems  HENT: Negative for facial swelling, sore throat and trouble swallowing.   Respiratory: Positive for shortness of breath. Negative for cough.   Cardiovascular: Negative for chest pain.  Gastrointestinal: Negative for abdominal pain, nausea and vomiting.  Skin: Negative for color change and rash.  Neurological: Positive for dizziness. Negative for syncope, weakness, light-headedness, numbness and headaches.  Psychiatric/Behavioral: Negative for confusion.    Physical Exam Updated Vital Signs BP 134/75 (BP Location: Right Arm)   Pulse 75   Temp 98.5 F (36.9 C) (Oral)   Resp 18   Ht 6\' 1"  (1.854 m)   Wt 83 kg   SpO2 100%   BMI 24.14 kg/m   Physical Exam Vitals and nursing note reviewed.  Constitutional:      General: He is not in acute distress.    Appearance: He is not ill-appearing, toxic-appearing or diaphoretic.  HENT:     Head: Normocephalic.  Jaw: No trismus or pain on movement.     Mouth/Throat:     Pharynx: Oropharynx is clear. Uvula midline. No pharyngeal swelling, oropharyngeal exudate, posterior oropharyngeal erythema or uvula swelling.  Eyes:     General: No scleral icterus.       Right eye: No discharge.        Left eye: No discharge.  Cardiovascular:     Rate and Rhythm: Normal rate.     Heart sounds: Normal heart sounds.  Pulmonary:     Effort: Pulmonary effort is normal. No respiratory distress.     Breath sounds: Normal breath sounds. No stridor. No wheezing, rhonchi or  rales.  Chest:     Chest wall: No tenderness.  Abdominal:     General: There is no distension. There are no signs of injury.     Palpations: Abdomen is soft. There is no mass or pulsatile mass.     Tenderness: There is no abdominal tenderness. There is no guarding or rebound.  Musculoskeletal:     Cervical back: Neck supple.  Skin:    General: Skin is warm and dry.     Coloration: Skin is not jaundiced or pale.     Findings: No erythema or rash.  Neurological:     General: No focal deficit present.     Mental Status: He is alert.  Psychiatric:        Behavior: Behavior is cooperative.     ED Results / Procedures / Treatments   Labs (all labs ordered are listed, but only abnormal results are displayed) Labs Reviewed - No data to display  EKG None  Radiology No results found.  Procedures Procedures   Medications Ordered in ED Medications - No data to display  ED Course  I have reviewed the triage vital signs and the nursing notes.  Pertinent labs & imaging results that were available during my care of the patient were reviewed by me and considered in my medical decision making (see chart for details).    MDM Rules/Calculators/A&P                          Alert 25 year old male no acute distress, nontoxic-appearing, no respiratory distress.  Presents with chief complaint of allergic reaction after receiving immunotherapy injection for his allergic rhinitis.  Patient reports that he developed dizziness, shortness of breath, and "brain fog," approximately 40 minutes after receiving his injection.  Symptoms continued for approximately 30 minutes at which point he is called his allergist who recommended using an EpiPen.  Patient should have EpiPen at 1810.    He denies any nausea, vomiting, difficulty swallowing, syncopal episodes, rash, chest pain.  Patient reports that his symptoms of progressively improved since epinephrine administration.  At present patient reports he  is back to his baseline.  Of his exam patient is not noted to have any rash, lungs clear to auscultation bilaterally, vitals stable.  Patient is able to speak in full complete sentences without difficulty.  Patient is able to handle his oral secretions without difficulty.  We will continue to monitor patient with plans to discharge if no changes in his condition.  On serial reexamination patient remains alert, in no acute distress, is able to speak in full complete sentences without difficulty, able to tolerate oral secretions, no rash observed.  Patient reports that he has an extra EpiPen at home.  He will follow up with his allergist.  Patient was advised not  to receive any further immunotherapy injections until following up with his allergist.  Patient was given strict return precautions.  He expresses understanding of all instructions and is agreeable with plan.  Patient and care were discussed with Dr. Lockie Mola.     Final Clinical Impression(s) / ED Diagnoses Final diagnoses:  Allergic reaction, initial encounter    Rx / DC Orders ED Discharge Orders    None       Berneice Heinrich 08/08/20 2211    Virgina Norfolk, DO 08/08/20 2256

## 2020-08-08 NOTE — ED Triage Notes (Signed)
Patient states he was giving himself an allergy shot when he became dizzy, had brain fog , and SOB. Patient called his doctor and was told to give himself an Epi injection in which he did.

## 2020-08-08 NOTE — Discharge Instructions (Signed)
You came to the emergency department to be evaluated for your allergic reaction.  Your physical exam and vital signs were reassuring.  Please follow-up with allergist Dr. [place] Callas.  Follow-up with your primary care provider as needed.  Return to the emergency department if you have another allergic reaction or need to use your epinephrine pen again.  Get help right away if: You have symptoms of anaphylaxis. These include: Swollen mouth, tongue, or throat. Pain or tightness in your chest. Trouble breathing or shortness of breath. Dizziness or fainting. Severe abdominal pain, vomiting, or diarrhe

## 2020-12-01 ENCOUNTER — Encounter: Payer: Self-pay | Admitting: Emergency Medicine

## 2020-12-01 ENCOUNTER — Other Ambulatory Visit: Payer: Self-pay

## 2020-12-01 ENCOUNTER — Ambulatory Visit (INDEPENDENT_AMBULATORY_CARE_PROVIDER_SITE_OTHER): Payer: 59 | Admitting: Emergency Medicine

## 2020-12-01 VITALS — BP 110/66 | HR 117 | Temp 98.4°F | Ht 73.0 in | Wt 193.2 lb

## 2020-12-01 DIAGNOSIS — Z0000 Encounter for general adult medical examination without abnormal findings: Secondary | ICD-10-CM | POA: Diagnosis not present

## 2020-12-01 DIAGNOSIS — Z13228 Encounter for screening for other metabolic disorders: Secondary | ICD-10-CM

## 2020-12-01 DIAGNOSIS — Z13220 Encounter for screening for lipoid disorders: Secondary | ICD-10-CM

## 2020-12-01 DIAGNOSIS — Z130 Encounter for screening for diseases of the blood and blood-forming organs and certain disorders involving the immune mechanism: Secondary | ICD-10-CM | POA: Diagnosis not present

## 2020-12-01 DIAGNOSIS — Z1329 Encounter for screening for other suspected endocrine disorder: Secondary | ICD-10-CM | POA: Diagnosis not present

## 2020-12-01 LAB — CBC WITH DIFFERENTIAL/PLATELET
Basophils Absolute: 0 10*3/uL (ref 0.0–0.1)
Basophils Relative: 0.6 % (ref 0.0–3.0)
Eosinophils Absolute: 0.1 10*3/uL (ref 0.0–0.7)
Eosinophils Relative: 2.7 % (ref 0.0–5.0)
HCT: 46.6 % (ref 39.0–52.0)
Hemoglobin: 15.9 g/dL (ref 13.0–17.0)
Lymphocytes Relative: 39.2 % (ref 12.0–46.0)
Lymphs Abs: 2.1 10*3/uL (ref 0.7–4.0)
MCHC: 34.1 g/dL (ref 30.0–36.0)
MCV: 97.5 fl (ref 78.0–100.0)
Monocytes Absolute: 0.7 10*3/uL (ref 0.1–1.0)
Monocytes Relative: 13.2 % — ABNORMAL HIGH (ref 3.0–12.0)
Neutro Abs: 2.4 10*3/uL (ref 1.4–7.7)
Neutrophils Relative %: 44.3 % (ref 43.0–77.0)
Platelets: 178 10*3/uL (ref 150.0–400.0)
RBC: 4.78 Mil/uL (ref 4.22–5.81)
RDW: 13.2 % (ref 11.5–15.5)
WBC: 5.3 10*3/uL (ref 4.0–10.5)

## 2020-12-01 LAB — HEMOGLOBIN A1C: Hgb A1c MFr Bld: 5.4 % (ref 4.6–6.5)

## 2020-12-01 NOTE — Patient Instructions (Signed)

## 2020-12-01 NOTE — Progress Notes (Signed)
Jon Leach 25 y.o.   Chief Complaint  Patient presents with  . Annual Exam    HISTORY OF PRESENT ILLNESS: This is a 25 y.o. male here for annual exam. Healthy male with a healthy lifestyle. Has no complaints or medical concerns today.  HPI   Prior to Admission medications   Medication Sig Start Date End Date Taking? Authorizing Provider  Levocetirizine Dihydrochloride (XYZAL PO) Take by mouth daily.   Yes [provider]  promethazine-dextromethorphan (PROMETHAZINE-DM) 6.25-15 MG/5ML syrup Take 5 mLs by mouth 4 (four) times daily as needed for cough. Patient not taking: Reported on 12/01/2020 07/14/20   Georgina Quint, MD  Triamcinolone Acetonide (NASACORT ALLERGY 24HR NA) Place into the nose daily.   Yes [provider]  azithromycin (ZITHROMAX) 250 MG tablet Sig as indicated Patient not taking: Reported on 12/01/2020 07/13/20   Georgina Quint, MD  HYDROcodone-homatropine [facility]) 5-1.5 MG/5ML syrup Take 5 mLs by mouth every 8 (eight) hours as needed for cough. Patient not taking: Reported on 12/01/2020 07/18/20   Georgina Quint, MD    Allergies  Allergen Reactions  . Penicillins     There are no problems to display for this patient.   History reviewed. No pertinent past medical history.  Past Surgical History:  Procedure Laterality Date  . EYE SURGERY    . TONSILLECTOMY    . tooth     wisdome tooth removed    Social History   Socioeconomic History  . Marital status: Single    Spouse name: Not on file  . Number of children: 0  . Years of education: Not on file  . Highest education level: Not on file  Occupational History  . Not on file  Tobacco Use  . Smoking status: Never Smoker  . Smokeless tobacco: Never Used  Vaping Use  . Vaping Use: Never used  Substance and Sexual Activity  . Alcohol use: Yes    Comment: occ.   . Drug use: No  . Sexual activity: Yes  Other Topics Concern  . Not on file  Social History  Narrative  . Not on file   Social Determinants of Health   Financial Resource Strain: Not on file  Food Insecurity: Not on file  Transportation Needs: Not on file  Physical Activity: Not on file  Stress: Not on file  Social Connections: Not on file  Intimate Partner Violence: Not on file    Family History  Problem Relation Age of Onset  . Healthy Mother   . Healthy Father      Review of Systems  Constitutional: Negative.  Negative for chills and fever.  HENT: Negative.  Negative for congestion and sore throat.   Respiratory: Negative.  Negative for cough and shortness of breath.   Cardiovascular: Negative.  Negative for chest pain and palpitations.  Gastrointestinal: Negative for abdominal pain, diarrhea, nausea and vomiting.  Genitourinary: Negative.  Negative for dysuria and hematuria.  Skin: Negative.  Negative for rash.  Neurological: Negative.  Negative for dizziness.  All other systems reviewed and are negative.   Today's Vitals   12/01/20 1057  BP: 110/66  Pulse: (!) 117  Temp: 98.4 F (36.9 C)  TempSrc: Oral  SpO2: 98%  Weight: 193 lb 3.2 oz (87.6 kg)  Height: 6\' 1"  (1.854 m)   Body mass index is 25.49 kg/m. Wt Readings from Last 3 Encounters:  12/01/20 193 lb 3.2 oz (87.6 kg)  08/08/20 183 lb (83 kg)  07/13/20 180  lb (81.6 kg)    Physical Exam Vitals reviewed.  Constitutional:      Appearance: Normal appearance.  HENT:     Head: Normocephalic.     Right Ear: Tympanic membrane, ear canal and external ear normal.     Left Ear: Tympanic membrane, ear canal and external ear normal.  Eyes:     Extraocular Movements: Extraocular movements intact.     Conjunctiva/sclera: Conjunctivae normal.     Pupils: Pupils are equal, round, and reactive to light.  Cardiovascular:     Rate and Rhythm: Normal rate and regular rhythm.     Pulses: Normal pulses.     Heart sounds: Normal heart sounds.  Pulmonary:     Effort: Pulmonary effort is normal.     Breath  sounds: Normal breath sounds.  Abdominal:     General: Bowel sounds are normal. There is no distension.     Palpations: Abdomen is soft.     Tenderness: There is no abdominal tenderness.  Musculoskeletal:        General: Normal range of motion.     Cervical back: Normal range of motion and neck supple. No tenderness.  Lymphadenopathy:     Cervical: No cervical adenopathy.  Skin:    General: Skin is warm and dry.     Capillary Refill: Capillary refill takes less than 2 seconds.  Neurological:     General: No focal deficit present.     Mental Status: He is alert and oriented to person, place, and time.  Psychiatric:        Mood and Affect: Mood normal.        Behavior: Behavior normal.      ASSESSMENT & PLAN: Jon RuizJohn was seen today for annual exam.  Diagnoses and all orders for this visit:  Routine general medical examination at a health care facility  Screening for deficiency anemia -     CBC with Differential/Platelet  Screening for lipoid disorders -     Lipid panel  Screening for endocrine, metabolic and immunity disorder -     Comprehensive metabolic panel -     Hemoglobin A1c   Modifiable risk factors discussed with patient. Anticipatory guidance according to age provided. The following topics were discussed: Smoking Diet and nutrition Benefits of exercise Cancer family history Vaccinations Cardiovascular risk assessment Mental health including depression and anxiety Fall and accident prevention  Patient Instructions     Health Maintenance, Male Adopting a healthy lifestyle and getting preventive care are important in promoting health and wellness. Ask your health care provider about:  The right schedule for you to have regular tests and exams.  Things you can do on your own to prevent diseases and keep yourself healthy. What should I know about diet, weight, and exercise? Eat a healthy diet  Eat a diet that includes plenty of vegetables, fruits,  low-fat dairy products, and lean protein.  Do not eat a lot of foods that are high in solid fats, added sugars, or sodium.   Maintain a healthy weight Body mass index (BMI) is a measurement that can be used to identify possible weight problems. It estimates body fat based on height and weight. Your health care provider can help determine your BMI and help you achieve or maintain a healthy weight. Get regular exercise Get regular exercise. This is one of the most important things you can do for your health. Most adults should:  Exercise for at least 150 minutes each week. The exercise should increase  your heart rate and make you sweat (moderate-intensity exercise).  Do strengthening exercises at least twice a week. This is in addition to the moderate-intensity exercise.  Spend less time sitting. Even light physical activity can be beneficial. Watch cholesterol and blood lipids Have your blood tested for lipids and cholesterol at 24 years of age, then have this test every 5 years. You may need to have your cholesterol levels checked more often if:  Your lipid or cholesterol levels are high.  You are older than 25 years of age.  You are at high risk for heart disease. What should I know about cancer screening? Many types of cancers can be detected early and may often be prevented. Depending on your health history and family history, you may need to have cancer screening at various ages. This may include screening for:  Colorectal cancer.  Prostate cancer.  Skin cancer.  Lung cancer. What should I know about heart disease, diabetes, and high blood pressure? Blood pressure and heart disease  High blood pressure causes heart disease and increases the risk of stroke. This is more likely to develop in people who have high blood pressure readings, are of African descent, or are overweight.  Talk with your health care provider about your target blood pressure readings.  Have your blood  pressure checked: ? Every 3-5 years if you are 14-93 years of age. ? Every year if you are 65 years old or older.  If you are between the ages of 78 and 28 and are a current or former smoker, ask your health care provider if you should have a one-time screening for abdominal aortic aneurysm (AAA). Diabetes Have regular diabetes screenings. This checks your fasting blood sugar level. Have the screening done:  Once every three years after age 30 if you are at a normal weight and have a low risk for diabetes.  More often and at a younger age if you are overweight or have a high risk for diabetes. What should I know about preventing infection? Hepatitis B If you have a higher risk for hepatitis B, you should be screened for this virus. Talk with your health care provider to find out if you are at risk for hepatitis B infection. Hepatitis C Blood testing is recommended for:  Everyone born from [birth year] through 1965.  Anyone with known risk factors for hepatitis C. Sexually transmitted infections (STIs)  You should be screened each year for STIs, including gonorrhea and chlamydia, if: ? You are sexually active and are younger than 25 years of age. ? You are older than 25 years of age and your health care provider tells you that you are at risk for this type of infection. ? Your sexual activity has changed since you were last screened, and you are at increased risk for chlamydia or gonorrhea. Ask your health care provider if you are at risk.  Ask your health care provider about whether you are at high risk for HIV. Your health care provider may recommend a prescription medicine to help prevent HIV infection. If you choose to take medicine to prevent HIV, you should first get tested for HIV. You should then be tested every 3 months for as long as you are taking the medicine. Follow these instructions at home: Lifestyle  Do not use any products that contain nicotine or tobacco, such as cigarettes,  e-cigarettes, and chewing tobacco. If you need help quitting, ask your health care provider.  Do not use street drugs.  Do  not share needles.  Ask your health care provider for help if you need support or information about quitting drugs. Alcohol use  Do not drink alcohol if your health care provider tells you not to drink.  If you drink alcohol: ? Limit how much you have to 0-2 drinks a day. ? Be aware of how much alcohol is in your drink. In the [place] one drink equals one 12 oz bottle of beer (355 mL), one 5 oz glass of wine (148 mL), or one 1 oz glass of hard liquor (44 mL). General instructions  Schedule regular health, dental, and eye exams.  Stay current with your vaccines.  Tell your health care provider if: ? You often feel depressed. ? You have ever been abused or do not feel safe at home. Summary  Adopting a healthy lifestyle and getting preventive care are important in promoting health and wellness.  Follow your health care provider's instructions about healthy diet, exercising, and getting tested or screened for diseases.  Follow your health care provider's instructions on monitoring your cholesterol and blood pressure. This information is not intended to replace advice given to you by your health care provider. Make sure you discuss any questions you have with your health care provider. Document Revised: 06/11/2018 Document Reviewed: 06/11/2018 Elsevier Patient Education  2021 Elsevier Inc.      Edwina Barth, MD [facility]

## 2020-12-02 LAB — COMPREHENSIVE METABOLIC PANEL
ALT: 21 U/L (ref 0–53)
AST: 23 U/L (ref 0–37)
Albumin: 4.5 g/dL (ref 3.5–5.2)
Alkaline Phosphatase: 54 U/L (ref 39–117)
BUN: 21 mg/dL (ref 6–23)
CO2: 30 mEq/L (ref 19–32)
Calcium: 9.5 mg/dL (ref 8.4–10.5)
Chloride: 105 mEq/L (ref 96–112)
Creatinine, Ser: 1 mg/dL (ref 0.40–1.50)
GFR: 105.02 mL/min (ref >60.00)
Glucose, Bld: 54 mg/dL — ABNORMAL LOW (ref 70–99)
Potassium: 4.1 mEq/L (ref 3.5–5.1)
Sodium: 141 mEq/L (ref 135–145)
Total Bilirubin: 0.7 mg/dL (ref 0.2–1.2)
Total Protein: 7.2 g/dL (ref 6.0–8.3)

## 2020-12-02 LAB — LIPID PANEL
Cholesterol: 119 mg/dL (ref 0–200)
HDL: 56.2 mg/dL (ref >39.00)
LDL Cholesterol: 53 mg/dL (ref 0–99)
NonHDL: 63.25
Total CHOL/HDL Ratio: 2
Triglycerides: 52 mg/dL (ref 0.0–149.0)
VLDL: 10.4 mg/dL (ref 0.0–40.0)

## 2021-07-17 ENCOUNTER — Telehealth (INDEPENDENT_AMBULATORY_CARE_PROVIDER_SITE_OTHER): Payer: 59 | Admitting: Family Medicine

## 2021-07-17 ENCOUNTER — Encounter: Payer: Self-pay | Admitting: Family Medicine

## 2021-07-17 ENCOUNTER — Other Ambulatory Visit: Payer: Self-pay

## 2021-07-17 VITALS — Temp 100.2°F | Ht 73.0 in | Wt 174.5 lb

## 2021-07-17 DIAGNOSIS — J22 Unspecified acute lower respiratory infection: Secondary | ICD-10-CM | POA: Insufficient documentation

## 2021-07-17 DIAGNOSIS — B309 Viral conjunctivitis, unspecified: Secondary | ICD-10-CM | POA: Insufficient documentation

## 2021-07-17 MED ORDER — CHERATUSSIN AC 100-10 MG/5ML PO SOLN: 5.0000 mL | Freq: Two times a day (BID) | ORAL | 0 refills | Status: DC | PRN

## 2021-07-17 NOTE — Assessment & Plan Note (Addendum)
Anticipate viral URI with cough.  Reviewed supportive measures at home.  Predominant concern is ongoing cough affecting sleep - will Rx cheratussin cough syrup with sedation precautions. Update if not improving with treatment.  I did suggest he check home COVID swab and let us know if tests positive.

## 2021-07-17 NOTE — Assessment & Plan Note (Addendum)
Discussed anticipated viral conjunctivitis. rec cool compresses, lubricating eye drops, hand washing given contagious nature. Update if worsening discharge or not improving as expected to Rx antibiotic eye drop. Pt agrees with plan.

## 2021-07-17 NOTE — Progress Notes (Signed)
Patient ID: Jon Leach, male    DOB: 31-Aug-1995, 26 y.o.   MRN: [id number]  Virtual visit completed through MyChart, a video enabled telemedicine application. Due to national recommendations of social distancing due to COVID-19, a virtual visit is felt to be most appropriate for this patient at this time. Reviewed limitations, risks, security and privacy concerns of performing a virtual visit and the availability of in person appointments. I also reviewed that there may be a patient responsible charge related to this service. The patient agreed to proceed.   Patient location: home Provider location: home Persons participating in this virtual visit: patient, provider   If any vitals were documented, they were collected by patient at home unless specified below.    Temp 100.2 F (37.9 C)    Ht 6\' 1"  (1.854 m)    Wt 174 lb 8 oz (79.2 kg)    BMI 23.02 kg/m    CC: cough, congestion Subjective:   HPI: Jon Leach is a 26 y.o. male presenting on 07/17/2021 for Cough (C/o dry cough and Rt eye redness/discharge.  Eye is matted in the mornings.  Cough started 1 wk ago and eye sxs started 2 days ago. 07/19/2021, old benzonatate and old albuterol inhaler. )   1 wk h/o dry cough associated with R pink eye/matted discharge in the morning x2 days, low grade fever. Also with ST, rhinorrhea. Cough wakes him up at night time affecting sleep.   No ear or tooth pain, HA, body aches, abd pain, nausea or diarrhea, dyspnea or wheezing. No rash.  Has not tested for COVID yet.  Had influenza 1 month ago, had COVID 2 months ago (2nd time).   No sick contacts at home.  Non smoker No h/o asthma.  H/o allergies - on allergy shots through allergist. He recently stopped this.   Treating with delsym, tessalon perls, and albuterol inh prn. Using humidifier. No significant benefit for cough with this.   Has had COVID vaccines including latest bivalent. Has had flu shot.      Relevant past medical, surgical,  family and social history reviewed and updated as indicated. Interim medical history since our last visit reviewed. Allergies and medications reviewed and updated. Outpatient Medications Prior to Visit  Medication Sig Dispense Refill   Levocetirizine Dihydrochloride (XYZAL PO) Take by mouth daily. As needed     azithromycin (ZITHROMAX) 250 MG tablet Sig as indicated (Patient not taking: Reported on 12/01/2020) 6 tablet 0   HYDROcodone-homatropine (HYCODAN) 5-1.5 MG/5ML syrup Take 5 mLs by mouth every 8 (eight) hours as needed for cough. (Patient not taking: Reported on 12/01/2020) 120 mL 0   promethazine-dextromethorphan (PROMETHAZINE-DM) 6.25-15 MG/5ML syrup Take 5 mLs by mouth 4 (four) times daily as needed for cough. (Patient not taking: Reported on 12/01/2020) 118 mL 1   Triamcinolone Acetonide (NASACORT ALLERGY 24HR NA) Place into the nose daily.     No facility-administered medications prior to visit.     Per HPI unless specifically indicated in ROS section below Review of Systems Objective:  Temp 100.2 F (37.9 C)    Ht 6\' 1"  (1.854 m)    Wt 174 lb 8 oz (79.2 kg)    BMI 23.02 kg/m   Wt Readings from Last 3 Encounters:  07/17/21 174 lb 8 oz (79.2 kg)  12/01/20 193 lb 3.2 oz (87.6 kg)  08/08/20 183 lb (83 kg)       Physical exam: Gen: alert, NAD, not ill appearing Eye:  injected bulbar conjunctiva on right without significant discharge present at this time, EOMI without pain Pulm: speaks in complete sentences without increased work of breathing Psych: normal mood, normal thought content      Assessment & Plan:   Problem List Items Addressed This Visit     Acute respiratory infection - Primary    Anticipate viral URI with cough.  Reviewed supportive measures at home.  Predominant concern is ongoing cough affecting sleep - will Rx cheratussin cough syrup with sedation precautions. Update if not improving with treatment.  I did suggest he check home COVID swab and let us know if  tests positive.       Viral conjunctivitis of right eye    Discussed anticipated viral conjunctivitis. rec cool compresses, lubricating eye drops, hand washing given contagious nature. Update if worsening discharge or not improving as expected to Rx antibiotic eye drop. Pt agrees with plan.         Meds ordered this encounter  Medications   guaiFENesin-codeine (CHERATUSSIN AC) 100-10 MG/5ML syrup    Sig: Take 5 mLs by mouth 2 (two) times daily as needed for cough (sedation precautions).    Dispense:  118 mL    Refill:  0   No orders of the defined types were placed in this encounter.   I discussed the assessment and treatment plan with the patient. The patient was provided an opportunity to ask questions and all were answered. The patient agreed with the plan and demonstrated an understanding of the instructions. The patient was advised to call back or seek an in-person evaluation if the symptoms worsen or if the condition fails to improve as anticipated.  Follow up plan: No follow-ups on file.  Eustaquio Boyden, MD

## 2021-09-24 IMAGING — DX DG FOOT COMPLETE 3+V*R*
3 series · 3 of 3 positions shown · non-contrast
Comparison: 11/06/2015

CLINICAL DATA: Third metatarsal pain, initial encounter

EXAM:
RIGHT FOOT COMPLETE - 3+ VIEW

[foot ap]
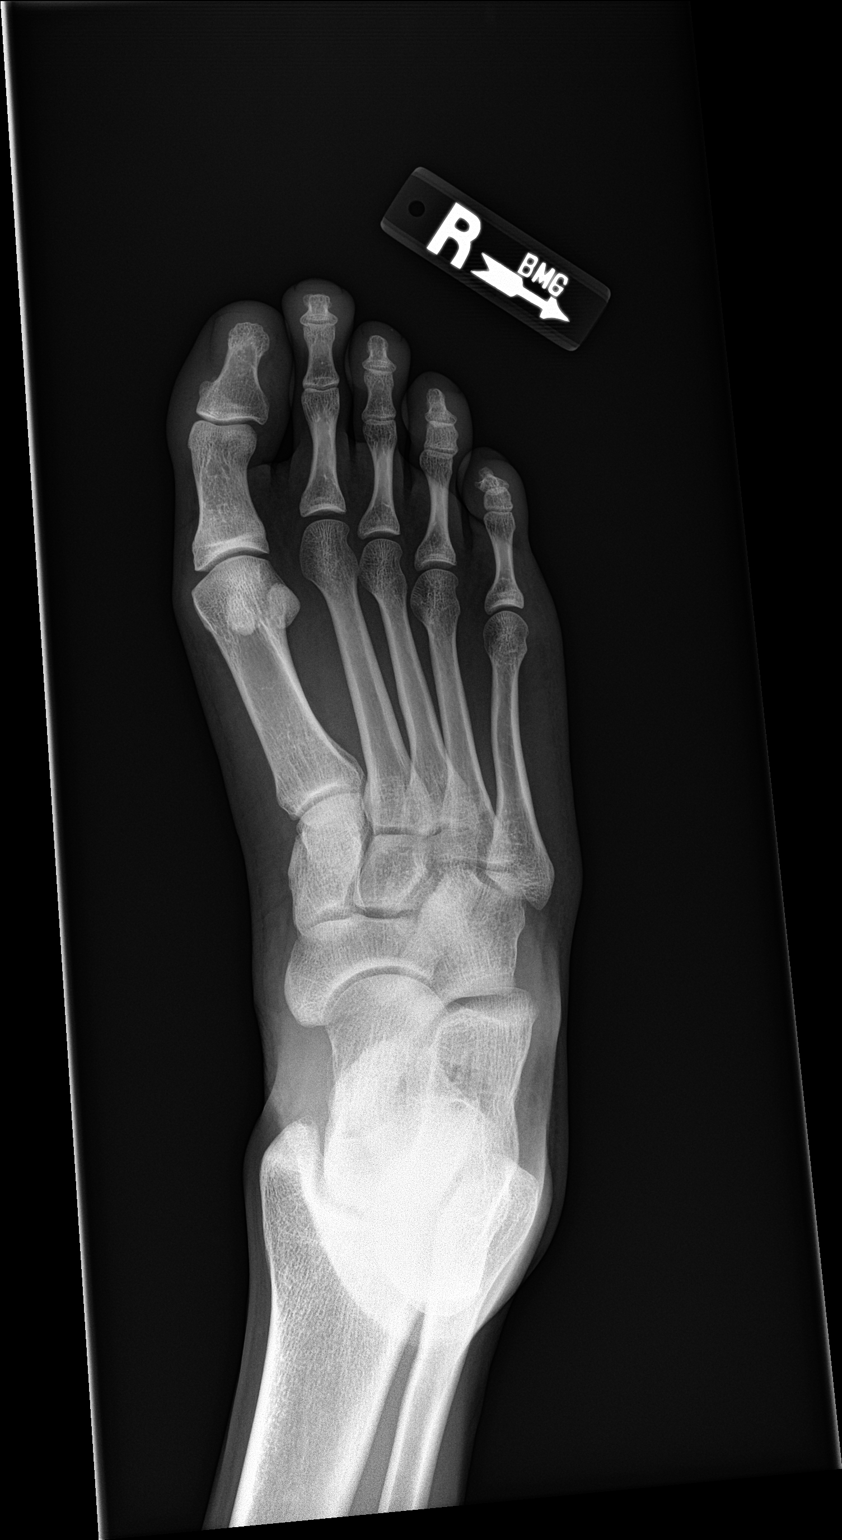

[foot obl]
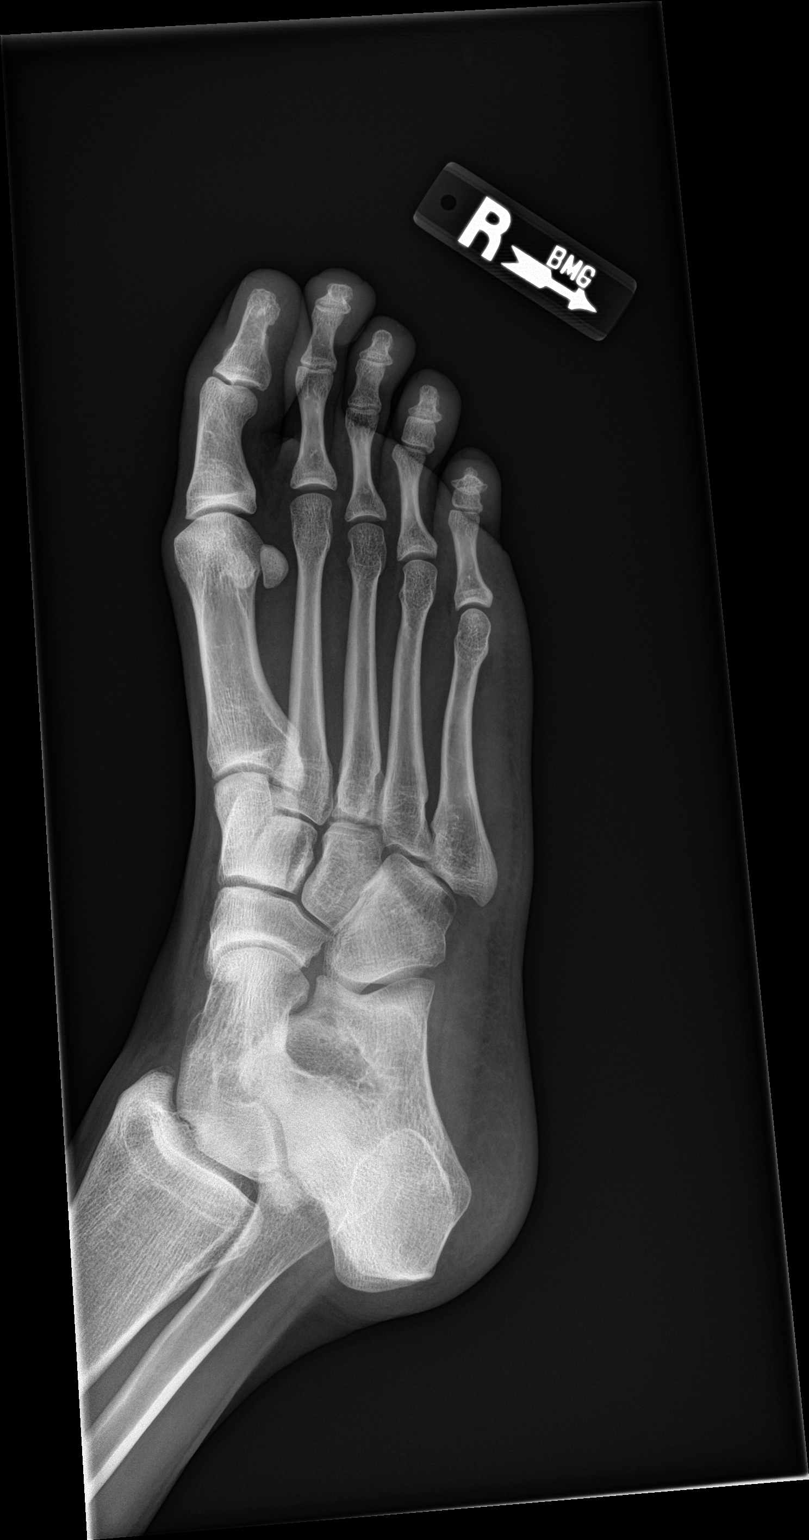

[foot lat]
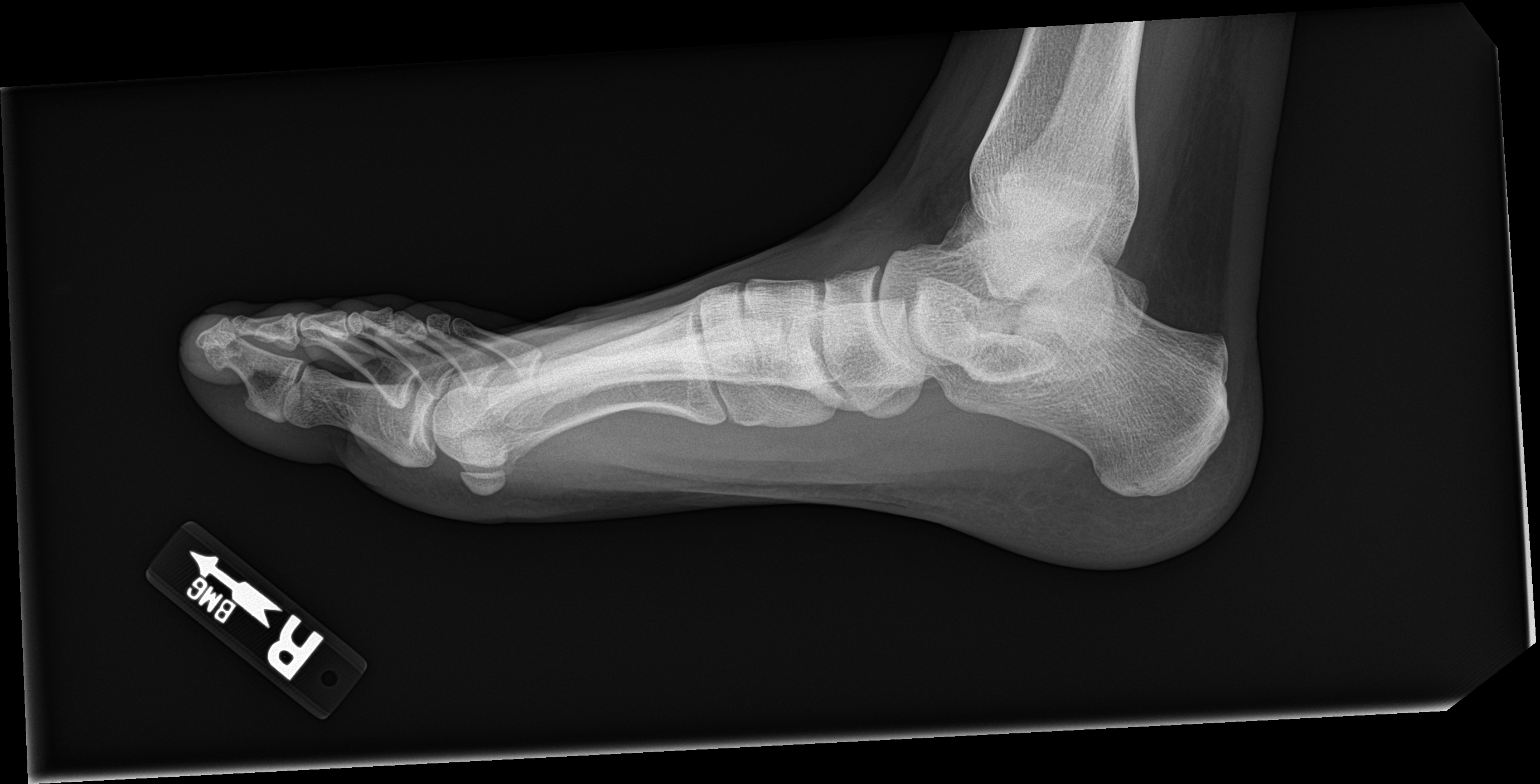

[3 of 3 positions shown; findings below may reference images not displayed]

FINDINGS: No sequelae from prior third metatarsal stress fracture are seen. No
acute fracture or dislocation is noted. No soft tissue abnormality
is noted.
IMPRESSION: No acute abnormality seen.

## 2022-02-28 DIAGNOSIS — L700 Acne vulgaris: Secondary | ICD-10-CM | POA: Diagnosis not present

## 2022-05-02 DIAGNOSIS — L700 Acne vulgaris: Secondary | ICD-10-CM | POA: Diagnosis not present

## 2022-06-14 DIAGNOSIS — Z6823 Body mass index (BMI) 23.0-23.9, adult: Secondary | ICD-10-CM | POA: Diagnosis not present

## 2022-06-14 DIAGNOSIS — R220 Localized swelling, mass and lump, head: Secondary | ICD-10-CM | POA: Diagnosis not present

## 2022-07-17 DIAGNOSIS — L700 Acne vulgaris: Secondary | ICD-10-CM | POA: Diagnosis not present

## 2022-08-31 DIAGNOSIS — Z6823 Body mass index (BMI) 23.0-23.9, adult: Secondary | ICD-10-CM | POA: Diagnosis not present

## 2022-08-31 DIAGNOSIS — J029 Acute pharyngitis, unspecified: Secondary | ICD-10-CM | POA: Diagnosis not present

## 2022-11-08 DIAGNOSIS — Z880 Allergy status to penicillin: Secondary | ICD-10-CM | POA: Diagnosis not present

## 2022-11-08 DIAGNOSIS — Z87892 Personal history of anaphylaxis: Secondary | ICD-10-CM | POA: Diagnosis not present

## 2023-01-15 DIAGNOSIS — L700 Acne vulgaris: Secondary | ICD-10-CM | POA: Diagnosis not present

## 2023-02-06 ENCOUNTER — Encounter: Payer: Self-pay | Admitting: Emergency Medicine

## 2023-02-06 ENCOUNTER — Ambulatory Visit (INDEPENDENT_AMBULATORY_CARE_PROVIDER_SITE_OTHER): Payer: 59 | Admitting: Emergency Medicine

## 2023-02-06 VITALS — BP 110/78 | HR 73 | Temp 98.0°F | Ht 73.0 in | Wt 170.0 lb

## 2023-02-06 DIAGNOSIS — Z13220 Encounter for screening for lipoid disorders: Secondary | ICD-10-CM | POA: Diagnosis not present

## 2023-02-06 DIAGNOSIS — Z13228 Encounter for screening for other metabolic disorders: Secondary | ICD-10-CM | POA: Diagnosis not present

## 2023-02-06 DIAGNOSIS — Z1159 Encounter for screening for other viral diseases: Secondary | ICD-10-CM | POA: Diagnosis not present

## 2023-02-06 DIAGNOSIS — Z1329 Encounter for screening for other suspected endocrine disorder: Secondary | ICD-10-CM

## 2023-02-06 DIAGNOSIS — Z130 Encounter for screening for diseases of the blood and blood-forming organs and certain disorders involving the immune mechanism: Secondary | ICD-10-CM | POA: Diagnosis not present

## 2023-02-06 DIAGNOSIS — Z0000 Encounter for general adult medical examination without abnormal findings: Secondary | ICD-10-CM

## 2023-02-06 LAB — CBC WITH DIFFERENTIAL/PLATELET
Basophils Absolute: 0 10*3/uL (ref 0.0–0.1)
Basophils Relative: 0.5 % (ref 0.0–3.0)
Eosinophils Absolute: 0.1 10*3/uL (ref 0.0–0.7)
Eosinophils Relative: 1.9 % (ref 0.0–5.0)
HCT: 44.5 % (ref 39.0–52.0)
Hemoglobin: 15 g/dL (ref 13.0–17.0)
Lymphocytes Relative: 31.6 % (ref 12.0–46.0)
Lymphs Abs: 1.7 10*3/uL (ref 0.7–4.0)
MCHC: 33.7 g/dL (ref 30.0–36.0)
MCV: 99.9 fl (ref 78.0–100.0)
Monocytes Absolute: 0.5 10*3/uL (ref 0.1–1.0)
Monocytes Relative: 8.7 % (ref 3.0–12.0)
Neutro Abs: 3.1 10*3/uL (ref 1.4–7.7)
Neutrophils Relative %: 57.3 % (ref 43.0–77.0)
Platelets: 178 10*3/uL (ref 150.0–400.0)
RBC: 4.45 Mil/uL (ref 4.22–5.81)
RDW: 12.7 % (ref 11.5–15.5)
WBC: 5.3 10*3/uL (ref 4.0–10.5)

## 2023-02-06 LAB — LIPID PANEL
Cholesterol: 151 mg/dL (ref 0–200)
HDL: 64.4 mg/dL (ref >39.00)
LDL Cholesterol: 78 mg/dL (ref 0–99)
NonHDL: 86.39
Total CHOL/HDL Ratio: 2
Triglycerides: 44 mg/dL (ref 0.0–149.0)
VLDL: 8.8 mg/dL (ref 0.0–40.0)

## 2023-02-06 LAB — COMPREHENSIVE METABOLIC PANEL
ALT: 42 U/L (ref 0–53)
AST: 46 U/L — ABNORMAL HIGH (ref 0–37)
Albumin: 4.6 g/dL (ref 3.5–5.2)
Alkaline Phosphatase: 39 U/L (ref 39–117)
BUN: 16 mg/dL (ref 6–23)
CO2: 29 mEq/L (ref 19–32)
Calcium: 9.7 mg/dL (ref 8.4–10.5)
Chloride: 101 mEq/L (ref 96–112)
Creatinine, Ser: 1.14 mg/dL (ref 0.40–1.50)
GFR: 88.38 mL/min (ref >60.00)
Glucose, Bld: 89 mg/dL (ref 70–99)
Potassium: 4.4 mEq/L (ref 3.5–5.1)
Sodium: 137 mEq/L (ref 135–145)
Total Bilirubin: 1 mg/dL (ref 0.2–1.2)
Total Protein: 7.2 g/dL (ref 6.0–8.3)

## 2023-02-06 LAB — HEMOGLOBIN A1C: Hgb A1c MFr Bld: 5.3 % (ref 4.6–6.5)

## 2023-02-06 NOTE — Progress Notes (Signed)
Jon Leach 27 y.o.   Chief Complaint  Patient presents with   Annual Exam    Concerns about low testosterone, wants some labs done     HISTORY OF PRESENT ILLNESS: This is a 27 y.o. male here for annual exam. Concerned testosterone levels due to diminished sex drive. Healthy male with a healthy lifestyle.  Exercises regularly. Non-smoker.  No EtOH user. No chronic medical conditions.  No chronic medications.  HPI   Prior to Admission medications   Medication Sig Start Date End Date Taking? Authorizing Provider  ketoconazole (NIZORAL) 2 % cream Apply 1 Application topically daily.   Yes [provider]  Levocetirizine Dihydrochloride (XYZAL PO) Take by mouth daily. As needed   Yes [provider]  minocycline (DYNACIN) 100 MG tablet Take 100 mg by mouth 2 (two) times daily.   Yes [provider]  tretinoin (RETIN-A) 0.025 % cream Apply topically at bedtime.   Yes [provider]  guaiFENesin-codeine (CHERATUSSIN AC) 100-10 MG/5ML syrup Take 5 mLs by mouth 2 (two) times daily as needed for cough (sedation precautions). Patient not taking: Reported on 02/06/2023 07/17/21   Eustaquio Boyden, MD    Allergies  Allergen Reactions   Penicillins     There are no problems to display for this patient.   History reviewed. No pertinent past medical history.  Past Surgical History:  Procedure Laterality Date   EYE SURGERY     TONSILLECTOMY     tooth     wisdome tooth removed    Social History   Socioeconomic History   Marital status: Single    Spouse name: Not on file   Number of children: 0   Years of education: Not on file   Highest education level: Not on file  Occupational History   Not on file  Tobacco Use   Smoking status: Never   Smokeless tobacco: Never  Vaping Use   Vaping status: Never Used  Substance and Sexual Activity   Alcohol use: Yes    Comment: occ.    Drug use: No   Sexual activity: Yes  Other Topics Concern    Not on file  Social History Narrative   Not on file   Social Determinants of Health   Financial Resource Strain: Not on file  Food Insecurity: Not on file  Transportation Needs: Not on file  Physical Activity: Not on file  Stress: Not on file  Social Connections: Not on file  Intimate Partner Violence: Not on file    Family History  Problem Relation Age of Onset   Healthy Mother    Healthy Father      Review of Systems  Constitutional: Negative.  Negative for chills and fever.  HENT: Negative.  Negative for congestion and sore throat.   Respiratory: Negative.  Negative for cough and shortness of breath.   Cardiovascular: Negative.  Negative for chest pain and palpitations.  Gastrointestinal:  Negative for abdominal pain, nausea and vomiting.  Genitourinary: Negative.  Negative for dysuria and hematuria.  Skin: Negative.  Negative for rash.  Neurological: Negative.  Negative for dizziness and headaches.    Today's Vitals   02/06/23 0847  BP: 110/78  Pulse: 73  Temp: 98 F (36.7 C)  TempSrc: Oral  SpO2: 98%  Weight: 170 lb (77.1 kg)  Height: 6\' 1"  (1.854 m)   Body mass index is 22.43 kg/m.   Physical Exam Vitals reviewed.  Constitutional:      Appearance: Normal appearance.  HENT:  Head: Normocephalic.     Right Ear: Tympanic membrane, ear canal and external ear normal.     Left Ear: Tympanic membrane, ear canal and external ear normal.     Mouth/Throat:     Mouth: Mucous membranes are moist.     Pharynx: Oropharynx is clear.  Eyes:     Extraocular Movements: Extraocular movements intact.     Conjunctiva/sclera: Conjunctivae normal.     Pupils: Pupils are equal, round, and reactive to light.  Cardiovascular:     Rate and Rhythm: Normal rate and regular rhythm.     Pulses: Normal pulses.     Heart sounds: Normal heart sounds.  Pulmonary:     Effort: Pulmonary effort is normal.     Breath sounds: Normal breath sounds.  Abdominal:     Palpations:  Abdomen is soft.     Tenderness: There is no abdominal tenderness.  Musculoskeletal:     Cervical back: Neck supple. No tenderness.  Lymphadenopathy:     Cervical: No cervical adenopathy.  Skin:    General: Skin is warm and dry.     Capillary Refill: Capillary refill takes less than 2 seconds.  Neurological:     General: No focal deficit present.     Mental Status: He is alert and oriented to person, place, and time.  Psychiatric:        Mood and Affect: Mood normal.        Behavior: Behavior normal.      ASSESSMENT & PLAN: Problem List Items Addressed This Visit   None Visit Diagnoses     Need for hepatitis C screening test    -  Primary   Relevant Orders   Hepatitis C antibody screen   Routine general medical examination at a health care facility       Relevant Orders   CBC with Differential   Comprehensive metabolic panel   Hemoglobin A1c   Lipid panel   Hepatitis C antibody screen   Screening for deficiency anemia       Relevant Orders   CBC with Differential   Screening for lipoid disorders       Relevant Orders   Lipid panel   Screening for endocrine, metabolic and immunity disorder       Relevant Orders   Comprehensive metabolic panel   Hemoglobin A1c     Modifiable risk factors discussed with patient. Anticipatory guidance according to age provided. The following topics were also discussed: Social Determinants of Health Smoking.  Non-smoker Diet and nutrition.  Good eating habits Benefits of exercise.  Exercises regularly Cancer family history review Vaccinations review and recommendations Cardiovascular risk assessment and need for blood work Mental health including depression and anxiety Fall and accident prevention  Patient Instructions  Health Maintenance, Male Adopting a healthy lifestyle and getting preventive care are important in promoting health and wellness. Ask your health care provider about: The right schedule for you to have regular  tests and exams. Things you can do on your own to prevent diseases and keep yourself healthy. What should I know about diet, weight, and exercise? Eat a healthy diet  Eat a diet that includes plenty of vegetables, fruits, low-fat dairy products, and lean protein. Do not eat a lot of foods that are high in solid fats, added sugars, or sodium. Maintain a healthy weight Body mass index (BMI) is a measurement that can be used to identify possible weight problems. It estimates body fat based on height and weight. Your  health care provider can help determine your BMI and help you achieve or maintain a healthy weight. Get regular exercise Get regular exercise. This is one of the most important things you can do for your health. Most adults should: Exercise for at least 150 minutes each week. The exercise should increase your heart rate and make you sweat (moderate-intensity exercise). Do strengthening exercises at least twice a week. This is in addition to the moderate-intensity exercise. Spend less time sitting. Even light physical activity can be beneficial. Watch cholesterol and blood lipids Have your blood tested for lipids and cholesterol at 27 years of age, then have this test every 5 years. You may need to have your cholesterol levels checked more often if: Your lipid or cholesterol levels are high. You are older than 27 years of age. You are at high risk for heart disease. What should I know about cancer screening? Many types of cancers can be detected early and may often be prevented. Depending on your health history and family history, you may need to have cancer screening at various ages. This may include screening for: Colorectal cancer. Prostate cancer. Skin cancer. Lung cancer. What should I know about heart disease, diabetes, and high blood pressure? Blood pressure and heart disease High blood pressure causes heart disease and increases the risk of stroke. This is more likely to  develop in people who have high blood pressure readings or are overweight. Talk with your health care provider about your target blood pressure readings. Have your blood pressure checked: Every 3-5 years if you are 81-32 years of age. Every year if you are 79 years old or older. If you are between the ages of 80 and 56 and are a current or former smoker, ask your health care provider if you should have a one-time screening for abdominal aortic aneurysm (AAA). Diabetes Have regular diabetes screenings. This checks your fasting blood sugar level. Have the screening done: Once every three years after age 57 if you are at a normal weight and have a low risk for diabetes. More often and at a younger age if you are overweight or have a high risk for diabetes. What should I know about preventing infection? Hepatitis B If you have a higher risk for hepatitis B, you should be screened for this virus. Talk with your health care provider to find out if you are at risk for hepatitis B infection. Hepatitis C Blood testing is recommended for: Everyone born from [birth year] through 1965. Anyone with known risk factors for hepatitis C. Sexually transmitted infections (STIs) You should be screened each year for STIs, including gonorrhea and chlamydia, if: You are sexually active and are younger than 27 years of age. You are older than 27 years of age and your health care provider tells you that you are at risk for this type of infection. Your sexual activity has changed since you were last screened, and you are at increased risk for chlamydia or gonorrhea. Ask your health care provider if you are at risk. Ask your health care provider about whether you are at high risk for HIV. Your health care provider may recommend a prescription medicine to help prevent HIV infection. If you choose to take medicine to prevent HIV, you should first get tested for HIV. You should then be tested every 3 months for as long as you are  taking the medicine. Follow these instructions at home: Alcohol use Do not drink alcohol if your health care provider tells you  not to drink. If you drink alcohol: Limit how much you have to 0-2 drinks a day. Know how much alcohol is in your drink. In the [place] one drink equals one 12 oz bottle of beer (355 mL), one 5 oz glass of wine (148 mL), or one 1 oz glass of hard liquor (44 mL). Lifestyle Do not use any products that contain nicotine or tobacco. These products include cigarettes, chewing tobacco, and vaping devices, such as e-cigarettes. If you need help quitting, ask your health care provider. Do not use street drugs. Do not share needles. Ask your health care provider for help if you need support or information about quitting drugs. General instructions Schedule regular health, dental, and eye exams. Stay current with your vaccines. Tell your health care provider if: You often feel depressed. You have ever been abused or do not feel safe at home. Summary Adopting a healthy lifestyle and getting preventive care are important in promoting health and wellness. Follow your health care provider's instructions about healthy diet, exercising, and getting tested or screened for diseases. Follow your health care provider's instructions on monitoring your cholesterol and blood pressure. This information is not intended to replace advice given to you by your health care provider. Make sure you discuss any questions you have with your health care provider. Document Revised: 11/07/2020 Document Reviewed: 11/07/2020 Elsevier Patient Education  2024 [employer]      Edwina Barth, MD [facility]

## 2023-02-06 NOTE — Patient Instructions (Signed)
Health Maintenance, Male Adopting a healthy lifestyle and getting preventive care are important in promoting health and wellness. Ask your health care provider about: The right schedule for you to have regular tests and exams. Things you can do on your own to prevent diseases and keep yourself healthy. What should I know about diet, weight, and exercise? Eat a healthy diet  Eat a diet that includes plenty of vegetables, fruits, low-fat dairy products, and lean protein. Do not eat a lot of foods that are high in solid fats, added sugars, or sodium. Maintain a healthy weight Body mass index (BMI) is a measurement that can be used to identify possible weight problems. It estimates body fat based on height and weight. Your health care provider can help determine your BMI and help you achieve or maintain a healthy weight. Get regular exercise Get regular exercise. This is one of the most important things you can do for your health. Most adults should: Exercise for at least 150 minutes each week. The exercise should increase your heart rate and make you sweat (moderate-intensity exercise). Do strengthening exercises at least twice a week. This is in addition to the moderate-intensity exercise. Spend less time sitting. Even light physical activity can be beneficial. Watch cholesterol and blood lipids Have your blood tested for lipids and cholesterol at 27 years of age, then have this test every 5 years. You may need to have your cholesterol levels checked more often if: Your lipid or cholesterol levels are high. You are older than 27 years of age. You are at high risk for heart disease. What should I know about cancer screening? Many types of cancers can be detected early and may often be prevented. Depending on your health history and family history, you may need to have cancer screening at various ages. This may include screening for: Colorectal cancer. Prostate cancer. Skin cancer. Lung  cancer. What should I know about heart disease, diabetes, and high blood pressure? Blood pressure and heart disease High blood pressure causes heart disease and increases the risk of stroke. This is more likely to develop in people who have high blood pressure readings or are overweight. Talk with your health care provider about your target blood pressure readings. Have your blood pressure checked: Every 3-5 years if you are 18-39 years of age. Every year if you are 40 years old or older. If you are between the ages of 65 and 75 and are a current or former smoker, ask your health care provider if you should have a one-time screening for abdominal aortic aneurysm (AAA). Diabetes Have regular diabetes screenings. This checks your fasting blood sugar level. Have the screening done: Once every three years after age 45 if you are at a normal weight and have a low risk for diabetes. More often and at a younger age if you are overweight or have a high risk for diabetes. What should I know about preventing infection? Hepatitis B If you have a higher risk for hepatitis B, you should be screened for this virus. Talk with your health care provider to find out if you are at risk for hepatitis B infection. Hepatitis C Blood testing is recommended for: Everyone born from 1945 through 1965. Anyone with known risk factors for hepatitis C. Sexually transmitted infections (STIs) You should be screened each year for STIs, including gonorrhea and chlamydia, if: You are sexually active and are younger than 27 years of age. You are older than 27 years of age and your   health care provider tells you that you are at risk for this type of infection. Your sexual activity has changed since you were last screened, and you are at increased risk for chlamydia or gonorrhea. Ask your health care provider if you are at risk. Ask your health care provider about whether you are at high risk for HIV. Your health care provider  may recommend a prescription medicine to help prevent HIV infection. If you choose to take medicine to prevent HIV, you should first get tested for HIV. You should then be tested every 3 months for as long as you are taking the medicine. Follow these instructions at home: Alcohol use Do not drink alcohol if your health care provider tells you not to drink. If you drink alcohol: Limit how much you have to 0-2 drinks a day. Know how much alcohol is in your drink. In the [place] one drink equals one 12 oz bottle of beer (355 mL), one 5 oz glass of wine (148 mL), or one 1 oz glass of hard liquor (44 mL). Lifestyle Do not use any products that contain nicotine or tobacco. These products include cigarettes, chewing tobacco, and vaping devices, such as e-cigarettes. If you need help quitting, ask your health care provider. Do not use street drugs. Do not share needles. Ask your health care provider for help if you need support or information about quitting drugs. General instructions Schedule regular health, dental, and eye exams. Stay current with your vaccines. Tell your health care provider if: You often feel depressed. You have ever been abused or do not feel safe at home. Summary Adopting a healthy lifestyle and getting preventive care are important in promoting health and wellness. Follow your health care provider's instructions about healthy diet, exercising, and getting tested or screened for diseases. Follow your health care provider's instructions on monitoring your cholesterol and blood pressure. This information is not intended to replace advice given to you by your health care provider. Make sure you discuss any questions you have with your health care provider. Document Revised: 11/07/2020 Document Reviewed: 11/07/2020 Elsevier Patient Education  2024 [employer].  

## 2023-02-28 ENCOUNTER — Other Ambulatory Visit: Payer: Self-pay | Admitting: Oncology

## 2023-02-28 DIAGNOSIS — Z006 Encounter for examination for normal comparison and control in clinical research program: Secondary | ICD-10-CM

## 2023-03-19 DIAGNOSIS — L648 Other androgenic alopecia: Secondary | ICD-10-CM | POA: Diagnosis not present

## 2023-03-19 DIAGNOSIS — L700 Acne vulgaris: Secondary | ICD-10-CM | POA: Diagnosis not present

## 2023-04-29 DIAGNOSIS — G5621 Lesion of ulnar nerve, right upper limb: Secondary | ICD-10-CM | POA: Diagnosis not present

## 2023-04-29 DIAGNOSIS — M25531 Pain in right wrist: Secondary | ICD-10-CM | POA: Diagnosis not present

## 2023-05-24 DIAGNOSIS — M79641 Pain in right hand: Secondary | ICD-10-CM | POA: Diagnosis not present

## 2023-05-24 DIAGNOSIS — G5621 Lesion of ulnar nerve, right upper limb: Secondary | ICD-10-CM | POA: Diagnosis not present

## 2023-05-24 DIAGNOSIS — M25531 Pain in right wrist: Secondary | ICD-10-CM | POA: Diagnosis not present

## 2023-05-28 DIAGNOSIS — S62141A Displaced fracture of body of hamate [unciform] bone, right wrist, initial encounter for closed fracture: Secondary | ICD-10-CM | POA: Diagnosis not present

## 2023-06-24 DIAGNOSIS — S62151D Displaced fracture of hook process of hamate [unciform] bone, right wrist, subsequent encounter for fracture with routine healing: Secondary | ICD-10-CM | POA: Diagnosis not present

## 2023-07-23 DIAGNOSIS — L700 Acne vulgaris: Secondary | ICD-10-CM | POA: Diagnosis not present

## 2023-07-23 DIAGNOSIS — L648 Other androgenic alopecia: Secondary | ICD-10-CM | POA: Diagnosis not present

## 2023-07-26 ENCOUNTER — Other Ambulatory Visit: Payer: Self-pay | Admitting: Orthopedic Surgery

## 2023-07-26 DIAGNOSIS — S62151D Displaced fracture of hook process of hamate [unciform] bone, right wrist, subsequent encounter for fracture with routine healing: Secondary | ICD-10-CM | POA: Diagnosis not present

## 2023-07-26 DIAGNOSIS — S62151A Displaced fracture of hook process of hamate [unciform] bone, right wrist, initial encounter for closed fracture: Secondary | ICD-10-CM

## 2023-08-02 ENCOUNTER — Ambulatory Visit
Admission: RE | Admit: 2023-08-02 | Discharge: 2023-08-02 | Disposition: A | Discharge status: Home / Self Care | Payer: 59 | Source: Ambulatory Visit | Attending: Orthopedic Surgery | Admitting: Orthopedic Surgery

## 2023-08-02 DIAGNOSIS — S62151K Displaced fracture of hook process of hamate [unciform] bone, right wrist, subsequent encounter for fracture with nonunion: Secondary | ICD-10-CM | POA: Diagnosis not present

## 2023-08-02 DIAGNOSIS — S62151A Displaced fracture of hook process of hamate [unciform] bone, right wrist, initial encounter for closed fracture: Secondary | ICD-10-CM

## 2023-08-02 DIAGNOSIS — M25531 Pain in right wrist: Secondary | ICD-10-CM | POA: Diagnosis not present

## 2024-01-12 ENCOUNTER — Inpatient Hospital Stay
Admission: RE | Admit: 2024-01-12 | Discharge: 2024-01-12 | Discharge status: Home / Self Care | Attending: Physician Assistant

## 2024-01-12 ENCOUNTER — Other Ambulatory Visit: Payer: Self-pay

## 2024-01-12 VITALS — BP 121/71 | HR 64 | Temp 97.9°F | Resp 18 | Ht 73.0 in | Wt 173.0 lb

## 2024-01-12 DIAGNOSIS — S61212A Laceration without foreign body of right middle finger without damage to nail, initial encounter: Secondary | ICD-10-CM | POA: Diagnosis not present

## 2024-01-12 NOTE — ED Triage Notes (Addendum)
 Pt presents with laceration to right middle finger. States he was cutting a watermelon, dropped the knife, and picked it up with his fingers. Occurred yesterday 7/12 at approximately 5-6 PM. Currently rates overall pain a 2/10. White bandage + Neosporin applied PTA.

## 2024-01-12 NOTE — ED Provider Notes (Signed)
 [facility]    CSN: [id number] Arrival date & time: 01/12/24  0851      History   Chief Complaint Chief Complaint  Patient presents with   Laceration    Laceration Right middle finger-Entered by patient    HPI Jon Leach is a 28 y.o. male.   HPI  Pt presents today with concern for a laceration to the right middle finger that occurred last night around 6 pm  He washed the wound under water and kept it covered with a bandage until arrival He reports the wound was made while cutting watermelon and tried to catch the knife after dropping it causing the cut Most recent tetanus booster was in 2019 per immunization records He denies numbness or tingling to distal aspect of the affected finger  History reviewed. No pertinent past medical history.  There are no active problems to display for this patient.   Past Surgical History:  Procedure Laterality Date   EYE SURGERY     TONSILLECTOMY     tooth     wisdome tooth removed       Home Medications    Prior to Admission medications   Medication Sig Start Date End Date Taking? Authorizing Provider  ketoconazole (NIZORAL) 2 % cream Apply 1 Application topically daily.    [provider]  Levocetirizine Dihydrochloride (XYZAL PO) Take by mouth daily. As needed    [provider]  minocycline (DYNACIN) 100 MG tablet Take 100 mg by mouth 2 (two) times daily.    [provider]  tretinoin (RETIN-A) 0.025 % cream Apply topically at bedtime.    [provider]    Family History Family History  Problem Relation Age of Onset   Healthy Mother    Healthy Father     Social History Social History   Tobacco Use   Smoking status: Never   Smokeless tobacco: Never  Vaping Use   Vaping status: Never Used  Substance Use Topics   Alcohol use: Yes    Comment: occ.    Drug use: No     Allergies   Penicillins   Review of Systems Review of Systems  Skin:  Positive for  wound.     Physical Exam Triage Vital Signs ED Triage Vitals  Encounter Vitals Group     BP 01/12/24 0901 121/71     Girls Systolic BP Percentile --      Girls Diastolic BP Percentile --      Boys Systolic BP Percentile --      Boys Diastolic BP Percentile --      Pulse Rate 01/12/24 0901 64     Resp 01/12/24 0901 18     Temp 01/12/24 0901 97.9 F (36.6 C)     Temp Source 01/12/24 0901 Oral     SpO2 01/12/24 0901 98 %     Weight 01/12/24 0903 173 lb (78.5 kg)     Height 01/12/24 0903 6' 1 (1.854 m)     Head Circumference --      Peak Flow --      Pain Score 01/12/24 0903 2     Pain Loc --      Pain Education --      Exclude from Growth Chart --    No data found.  Updated Vital Signs BP 121/71 (BP Location: Right Arm)   Pulse 64   Temp 97.9 F (36.6 C) (Oral)   Resp 18   Ht 6' 1 (1.854  m)   Wt 173 lb (78.5 kg)   SpO2 98%   BMI 22.82 kg/m   Visual Acuity Right Eye Distance:   Left Eye Distance:   Bilateral Distance:    Right Eye Near:   Left Eye Near:    Bilateral Near:     Physical Exam Vitals reviewed.  Constitutional:      General: He is awake.     Appearance: Normal appearance. He is well-developed and well-groomed.  HENT:     Head: Normocephalic and atraumatic.  Eyes:     Extraocular Movements: Extraocular movements intact.     Conjunctiva/sclera: Conjunctivae normal.  Pulmonary:     Effort: Pulmonary effort is normal.  Musculoskeletal:     Cervical back: Normal range of motion.  Skin:    Findings: Laceration present.     Comments: Approx 2.5 cm long superficial laceration to the right middle finger present. Wound appears to have clean edges and no obvious foreign bodies present. He has intact ROM of the finger with regards to flexion and extension   Neurological:     Mental Status: He is alert and oriented to person, place, and time.  Psychiatric:        Attention and Perception: Attention normal.        Mood and Affect: Mood normal.         Speech: Speech normal.        Behavior: Behavior normal. Behavior is cooperative.      UC Treatments / Results  Labs (all labs ordered are listed, but only abnormal results are displayed) Labs Reviewed - No data to display  EKG   Radiology No results found.  Procedures Laceration Repair  Date/Time: 01/12/2024 9:33 AM  Performed by: Marylene Rocky BRAVO, PA-C Authorized by: Marylene Rocky BRAVO, PA-C   Consent:    Consent obtained:  Verbal   Consent given by:  Patient   Risks, benefits, and alternatives were discussed: yes     Risks discussed:  Infection, pain, poor wound healing, need for additional repair and poor cosmetic result   Alternatives discussed:  No treatment, delayed treatment, observation and referral Universal protocol:    Procedure explained and questions answered to patient or proxy's satisfaction: yes     Patient identity confirmed:  Verbally with patient Anesthesia:    Anesthesia method:  None Laceration details:    Location:  Finger   Finger location:  R long finger   Length (cm):  2.5   Depth (mm):  2 Pre-procedure details:    Preparation:  Patient was prepped and draped in usual sterile fashion Exploration:    Limited defect created (wound extended): no     Hemostasis achieved with:  Tourniquet   Wound exploration: wound explored through full range of motion     Wound extent: fascia violated     Wound extent: no areolar tissue violation noted, no foreign bodies/material noted and no muscle damage noted     Contaminated: no   Treatment:    Area cleansed with:  Chlorhexidine and povidone-iodine   Amount of cleaning:  Standard   Visualized foreign bodies/material removed: no     Debridement:  None   Undermining:  None   Scar revision: no   Skin repair:    Repair method:  Tissue adhesive Approximation:    Approximation:  Close Repair type:    Repair type:  Simple Post-procedure details:    Dressing:  Adhesive bandage   Procedure completion:   Tolerated well, no  immediate complications Comments:     Wound was cleansed with Chlorhexadine and warm water soak. Prior to tissue adhesive application the wound was cleansed with iodine. Tissue adhesive applied without complications.   (including critical care time)  Medications Ordered in UC Medications - No data to display  Initial Impression / Assessment and Plan / UC Course  I have reviewed the triage vital signs and the nursing notes.  Pertinent labs & imaging results that were available during my care of the patient were reviewed by me and considered in my medical decision making (see chart for details).      Final Clinical Impressions(s) / UC Diagnoses   Final diagnoses:  Laceration of right middle finger without foreign body without damage to nail, initial encounter   Pt was seen today for concerns of laceration to the right middle finger that occurred last night while he was slicing watermelon. Most recent Tdap was administered in 2019. Wound has clean edges and was irrigated shortly after injury. He has intact ROM of the finger and appears neurovascularly intact today. Discussed closure options such as sutures and Dermabond procedures. Pt was more amenable to Dermabond today. Procedure was completed as noted above without complications. Reviewed home care measures and return precautions. Follow up as needed.     Discharge Instructions      You were seen today for a laceration to your right middle finger. This was repaired using a skin glue called Dermabond. The glue will come off on it's own as the wound heals so you do not need to return to urgent care for removal or follow up unless you have complications or concerns You can keep the area clean with warm water and gentle soap.  Please pat to dry and avoid scrubbing the area or using harsh cleansers such as alcohol or peroxide You can take tylenol and ibuprofen  as needed for pain management  If you develop any of the  following please return here for further evaluation: Redness or swelling around the laceration site, difficulty moving or bending your finger, drainage that looks like pus from the wound, increased warmth to the area, red streaks emanating from the laceration site, fever or chills.      ED Prescriptions   None    PDMP not reviewed this encounter.   Alyric Parkin, Rocky BRAVO, PA-C 01/12/24 1025

## 2024-01-12 NOTE — Discharge Instructions (Addendum)
 You were seen today for a laceration to your right middle finger. This was repaired using a skin glue called Dermabond. The glue will come off on it's own as the wound heals so you do not need to return to urgent care for removal or follow up unless you have complications or concerns You can keep the area clean with warm water and gentle soap.  Please pat to dry and avoid scrubbing the area or using harsh cleansers such as alcohol or peroxide You can take tylenol and ibuprofen  as needed for pain management  If you develop any of the following please return here for further evaluation: Redness or swelling around the laceration site, difficulty moving or bending your finger, drainage that looks like pus from the wound, increased warmth to the area, red streaks emanating from the laceration site, fever or chills.

## 2024-01-28 DIAGNOSIS — M25562 Pain in left knee: Secondary | ICD-10-CM | POA: Diagnosis not present

## 2024-01-28 DIAGNOSIS — M2140 Flat foot [pes planus] (acquired), unspecified foot: Secondary | ICD-10-CM | POA: Diagnosis not present

## 2024-01-28 DIAGNOSIS — M6240 Contracture of muscle, unspecified site: Secondary | ICD-10-CM | POA: Diagnosis not present

## 2024-02-13 ENCOUNTER — Encounter: Payer: Self-pay | Admitting: Emergency Medicine

## 2024-02-13 ENCOUNTER — Ambulatory Visit: Payer: Self-pay | Admitting: Emergency Medicine

## 2024-02-13 ENCOUNTER — Ambulatory Visit (INDEPENDENT_AMBULATORY_CARE_PROVIDER_SITE_OTHER): Admitting: Emergency Medicine

## 2024-02-13 VITALS — BP 122/72 | HR 60 | Temp 98.2°F | Ht 73.0 in | Wt 175.0 lb

## 2024-02-13 DIAGNOSIS — Z0000 Encounter for general adult medical examination without abnormal findings: Secondary | ICD-10-CM | POA: Diagnosis not present

## 2024-02-13 DIAGNOSIS — Z13220 Encounter for screening for lipoid disorders: Secondary | ICD-10-CM

## 2024-02-13 DIAGNOSIS — Z8042 Family history of malignant neoplasm of prostate: Secondary | ICD-10-CM

## 2024-02-13 DIAGNOSIS — Z130 Encounter for screening for diseases of the blood and blood-forming organs and certain disorders involving the immune mechanism: Secondary | ICD-10-CM

## 2024-02-13 DIAGNOSIS — Z13228 Encounter for screening for other metabolic disorders: Secondary | ICD-10-CM | POA: Diagnosis not present

## 2024-02-13 DIAGNOSIS — Z1329 Encounter for screening for other suspected endocrine disorder: Secondary | ICD-10-CM | POA: Diagnosis not present

## 2024-02-13 LAB — LIPID PANEL
Cholesterol: 143 mg/dL (ref 0–200)
HDL: 63.8 mg/dL (ref >39.00)
LDL Cholesterol: 70 mg/dL (ref 0–99)
NonHDL: 79.31
Total CHOL/HDL Ratio: 2
Triglycerides: 47 mg/dL (ref 0.0–149.0)
VLDL: 9.4 mg/dL (ref 0.0–40.0)

## 2024-02-13 LAB — COMPREHENSIVE METABOLIC PANEL WITH GFR
ALT: 16 U/L (ref 0–53)
AST: 18 U/L (ref 0–37)
Albumin: 4.6 g/dL (ref 3.5–5.2)
Alkaline Phosphatase: 46 U/L (ref 39–117)
BUN: 15 mg/dL (ref 6–23)
CO2: 29 meq/L (ref 19–32)
Calcium: 9.5 mg/dL (ref 8.4–10.5)
Chloride: 103 meq/L (ref 96–112)
Creatinine, Ser: 1.07 mg/dL (ref 0.40–1.50)
GFR: 94.68 mL/min (ref >60.00)
Glucose, Bld: 91 mg/dL (ref 70–99)
Potassium: 4.6 meq/L (ref 3.5–5.1)
Sodium: 138 meq/L (ref 135–145)
Total Bilirubin: 0.8 mg/dL (ref 0.2–1.2)
Total Protein: 7 g/dL (ref 6.0–8.3)

## 2024-02-13 LAB — CBC WITH DIFFERENTIAL/PLATELET
Basophils Absolute: 0 K/uL (ref 0.0–0.1)
Basophils Relative: 0.7 % (ref 0.0–3.0)
Eosinophils Absolute: 0.2 K/uL (ref 0.0–0.7)
Eosinophils Relative: 3.5 % (ref 0.0–5.0)
HCT: 44.4 % (ref 39.0–52.0)
Hemoglobin: 15.1 g/dL (ref 13.0–17.0)
Lymphocytes Relative: 40 % (ref 12.0–46.0)
Lymphs Abs: 1.9 K/uL (ref 0.7–4.0)
MCHC: 34.1 g/dL (ref 30.0–36.0)
MCV: 96.4 fl (ref 78.0–100.0)
Monocytes Absolute: 0.5 K/uL (ref 0.1–1.0)
Monocytes Relative: 10.8 % (ref 3.0–12.0)
Neutro Abs: 2.1 K/uL (ref 1.4–7.7)
Neutrophils Relative %: 45 % (ref 43.0–77.0)
Platelets: 196 K/uL (ref 150.0–400.0)
RBC: 4.61 Mil/uL (ref 4.22–5.81)
RDW: 12.4 % (ref 11.5–15.5)
WBC: 4.8 K/uL (ref 4.0–10.5)

## 2024-02-13 LAB — HEMOGLOBIN A1C: Hgb A1c MFr Bld: 5.5 % (ref 4.6–6.5)

## 2024-02-13 LAB — PSA: PSA: 0.26 ng/mL (ref 0.10–4.00)

## 2024-02-13 NOTE — Patient Instructions (Signed)
 Health Maintenance, Male  Adopting a healthy lifestyle and getting preventive care are important in promoting health and wellness. Ask your health care provider about:  The right schedule for you to have regular tests and exams.  Things you can do on your own to prevent diseases and keep yourself healthy.  What should I know about diet, weight, and exercise?  Eat a healthy diet    Eat a diet that includes plenty of vegetables, fruits, low-fat dairy products, and lean protein.  Do not eat a lot of foods that are high in solid fats, added sugars, or sodium.  Maintain a healthy weight  Body mass index (BMI) is a measurement that can be used to identify possible weight problems. It estimates body fat based on height and weight. Your health care provider can help determine your BMI and help you achieve or maintain a healthy weight.  Get regular exercise  Get regular exercise. This is one of the most important things you can do for your health. Most adults should:  Exercise for at least 150 minutes each week. The exercise should increase your heart rate and make you sweat (moderate-intensity exercise).  Do strengthening exercises at least twice a week. This is in addition to the moderate-intensity exercise.  Spend less time sitting. Even light physical activity can be beneficial.  Watch cholesterol and blood lipids  Have your blood tested for lipids and cholesterol at 28 years of age, then have this test every 5 years.  You may need to have your cholesterol levels checked more often if:  Your lipid or cholesterol levels are high.  You are older than 28 years of age.  You are at high risk for heart disease.  What should I know about cancer screening?  Many types of cancers can be detected early and may often be prevented. Depending on your health history and family history, you may need to have cancer screening at various ages. This may include screening for:  Colorectal cancer.  Prostate cancer.  Skin cancer.  Lung  cancer.  What should I know about heart disease, diabetes, and high blood pressure?  Blood pressure and heart disease  High blood pressure causes heart disease and increases the risk of stroke. This is more likely to develop in people who have high blood pressure readings or are overweight.  Talk with your health care provider about your target blood pressure readings.  Have your blood pressure checked:  Every 3-5 years if you are 28-52 years of age.  Every year if you are 3 years old or older.  If you are between the ages of 60 and 72 and are a current or former smoker, ask your health care provider if you should have a one-time screening for abdominal aortic aneurysm (AAA).  Diabetes  Have regular diabetes screenings. This checks your fasting blood sugar level. Have the screening done:  Once every three years after age 66 if you are at a normal weight and have a low risk for diabetes.  More often and at a younger age if you are overweight or have a high risk for diabetes.  What should I know about preventing infection?  Hepatitis B  If you have a higher risk for hepatitis B, you should be screened for this virus. Talk with your health care provider to find out if you are at risk for hepatitis B infection.  Hepatitis C  Blood testing is recommended for:  Everyone born from [birth year] through 1965.  Anyone  with known risk factors for hepatitis C.  Sexually transmitted infections (STIs)  You should be screened each year for STIs, including gonorrhea and chlamydia, if:  You are sexually active and are younger than 28 years of age.  You are older than 28 years of age and your health care provider tells you that you are at risk for this type of infection.  Your sexual activity has changed since you were last screened, and you are at increased risk for chlamydia or gonorrhea. Ask your health care provider if you are at risk.  Ask your health care provider about whether you are at high risk for HIV. Your health care provider  may recommend a prescription medicine to help prevent HIV infection. If you choose to take medicine to prevent HIV, you should first get tested for HIV. You should then be tested every 3 months for as long as you are taking the medicine.  Follow these instructions at home:  Alcohol use  Do not drink alcohol if your health care provider tells you not to drink.  If you drink alcohol:  Limit how much you have to 0-2 drinks a day.  Know how much alcohol is in your drink. In the [place] one drink equals one 12 oz bottle of beer (355 mL), one 5 oz glass of wine (148 mL), or one 1 oz glass of hard liquor (44 mL).  Lifestyle  Do not use any products that contain nicotine or tobacco. These products include cigarettes, chewing tobacco, and vaping devices, such as e-cigarettes. If you need help quitting, ask your health care provider.  Do not use street drugs.  Do not share needles.  Ask your health care provider for help if you need support or information about quitting drugs.  General instructions  Schedule regular health, dental, and eye exams.  Stay current with your vaccines.  Tell your health care provider if:  You often feel depressed.  You have ever been abused or do not feel safe at home.  Summary  Adopting a healthy lifestyle and getting preventive care are important in promoting health and wellness.  Follow your health care provider's instructions about healthy diet, exercising, and getting tested or screened for diseases.  Follow your health care provider's instructions on monitoring your cholesterol and blood pressure.  This information is not intended to replace advice given to you by your health care provider. Make sure you discuss any questions you have with your health care provider.  Document Revised: 11/07/2020 Document Reviewed: 11/07/2020  Elsevier Patient Education  2024 [employer].

## 2024-02-13 NOTE — Progress Notes (Signed)
 Jon Leach 28 y.o.   Chief Complaint  Patient presents with   Annual Exam    Patient here for physical. No other conerns    HISTORY OF PRESENT ILLNESS: This is a 28 y.o. male here for annual physical. Healthy male with a healthy lifestyle. No other complaints or medical concerns today.  HPI   Prior to Admission medications   Medication Sig Start Date End Date Taking? Authorizing Provider  ketoconazole (NIZORAL) 2 % cream Apply 1 Application topically daily.   Yes [provider]  Levocetirizine Dihydrochloride (XYZAL PO) Take by mouth daily. As needed   Yes [provider]  tretinoin (RETIN-A) 0.025 % cream Apply topically at bedtime.   Yes [provider]    Allergies  Allergen Reactions   Penicillins     There are no active problems to display for this patient.   History reviewed. No pertinent past medical history.  Past Surgical History:  Procedure Laterality Date   EYE SURGERY     TONSILLECTOMY     tooth     wisdome tooth removed    Social History   Socioeconomic History   Marital status: Single    Spouse name: Not on file   Number of children: 0   Years of education: Not on file   Highest education level: Not on file  Occupational History   Not on file  Tobacco Use   Smoking status: Never   Smokeless tobacco: Never  Vaping Use   Vaping status: Never Used  Substance and Sexual Activity   Alcohol use: Yes    Comment: occ.    Drug use: No   Sexual activity: Yes  Other Topics Concern   Not on file  Social History Narrative   Not on file   Social Drivers of Health   Financial Resource Strain: Not on file  Food Insecurity: Not on file  Transportation Needs: Not on file  Physical Activity: Not on file  Stress: Not on file  Social Connections: Not on file  Intimate Partner Violence: Not on file    Family History  Problem Relation Age of Onset   Healthy Mother    Healthy Father      Review of Systems   Constitutional: Negative.  Negative for chills and fever.  HENT: Negative.  Negative for congestion and sore throat.   Respiratory: Negative.  Negative for cough and shortness of breath.   Cardiovascular: Negative.  Negative for chest pain and palpitations.  Gastrointestinal:  Negative for abdominal pain, diarrhea, nausea and vomiting.  Genitourinary: Negative.  Negative for dysuria and hematuria.  Skin: Negative.  Negative for rash.  Neurological: Negative.  Negative for dizziness and headaches.  All other systems reviewed and are negative.   Vitals:   02/13/24 0807  BP: 122/72  Pulse: 60  Temp: 98.2 F (36.8 C)  SpO2: 98%    Physical Exam Vitals reviewed.  Constitutional:      Appearance: Normal appearance.  HENT:     Head: Normocephalic.     Right Ear: Tympanic membrane, ear canal and external ear normal.     Left Ear: Tympanic membrane, ear canal and external ear normal.     Mouth/Throat:     Mouth: Mucous membranes are moist.     Pharynx: Oropharynx is clear.  Eyes:     Extraocular Movements: Extraocular movements intact.     Conjunctiva/sclera: Conjunctivae normal.     Pupils: Pupils are equal, round, and reactive to light.  Cardiovascular:     Rate and Rhythm: Normal rate and regular rhythm.     Pulses: Normal pulses.     Heart sounds: Normal heart sounds.  Pulmonary:     Effort: Pulmonary effort is normal.     Breath sounds: Normal breath sounds.  Abdominal:     Palpations: Abdomen is soft.     Tenderness: There is no abdominal tenderness.  Musculoskeletal:     Cervical back: No tenderness.     Right lower leg: No edema.     Left lower leg: No edema.  Lymphadenopathy:     Cervical: No cervical adenopathy.  Skin:    General: Skin is warm and dry.     Capillary Refill: Capillary refill takes less than 2 seconds.  Neurological:     General: No focal deficit present.     Mental Status: He is alert and oriented to person, place, and time.  Psychiatric:         Mood and Affect: Mood normal.        Behavior: Behavior normal.      ASSESSMENT & PLAN: Problem List Items Addressed This Visit   None Visit Diagnoses       Routine general medical examination at a health care facility    -  Primary   Relevant Orders   CBC with Differential/Platelet   Comprehensive metabolic panel with GFR   Hemoglobin A1c   Lipid panel   PSA     Family history of prostate cancer       Relevant Orders   PSA     Screening for deficiency anemia       Relevant Orders   CBC with Differential/Platelet     Screening for lipoid disorders       Relevant Orders   Lipid panel     Screening for endocrine, metabolic and immunity disorder       Relevant Orders   Comprehensive metabolic panel with GFR   Hemoglobin A1c      Modifiable risk factors discussed with patient. Anticipatory guidance according to age provided. The following topics were also discussed: Social Determinants of Health Smoking.  Non-smoker Diet and nutrition.  Good eating habits Benefits of exercise.  Exercises regularly Cancer family history reviewed.  Positive history for prostate cancer.  Father in his 33s Vaccinations review and recommendations Cardiovascular risk assessment and need for blood work Mental health including depression and anxiety Fall and accident prevention  Patient Instructions  Health Maintenance, Male Adopting a healthy lifestyle and getting preventive care are important in promoting health and wellness. Ask your health care provider about: The right schedule for you to have regular tests and exams. Things you can do on your own to prevent diseases and keep yourself healthy. What should I know about diet, weight, and exercise? Eat a healthy diet  Eat a diet that includes plenty of vegetables, fruits, low-fat dairy products, and lean protein. Do not eat a lot of foods that are high in solid fats, added sugars, or sodium. Maintain a healthy weight Body mass  index (BMI) is a measurement that can be used to identify possible weight problems. It estimates body fat based on height and weight. Your health care provider can help determine your BMI and help you achieve or maintain a healthy weight. Get regular exercise Get regular exercise. This is one of the most important things you can do for your health. Most adults should: Exercise for at least 150 minutes each week. The  exercise should increase your heart rate and make you sweat (moderate-intensity exercise). Do strengthening exercises at least twice a week. This is in addition to the moderate-intensity exercise. Spend less time sitting. Even light physical activity can be beneficial. Watch cholesterol and blood lipids Have your blood tested for lipids and cholesterol at 28 years of age, then have this test every 5 years. You may need to have your cholesterol levels checked more often if: Your lipid or cholesterol levels are high. You are older than 28 years of age. You are at high risk for heart disease. What should I know about cancer screening? Many types of cancers can be detected early and may often be prevented. Depending on your health history and family history, you may need to have cancer screening at various ages. This may include screening for: Colorectal cancer. Prostate cancer. Skin cancer. Lung cancer. What should I know about heart disease, diabetes, and high blood pressure? Blood pressure and heart disease High blood pressure causes heart disease and increases the risk of stroke. This is more likely to develop in people who have high blood pressure readings or are overweight. Talk with your health care provider about your target blood pressure readings. Have your blood pressure checked: Every 3-5 years if you are 97-72 years of age. Every year if you are 60 years old or older. If you are between the ages of 5 and 30 and are a current or former smoker, ask your health care  provider if you should have a one-time screening for abdominal aortic aneurysm (AAA). Diabetes Have regular diabetes screenings. This checks your fasting blood sugar level. Have the screening done: Once every three years after age 66 if you are at a normal weight and have a low risk for diabetes. More often and at a younger age if you are overweight or have a high risk for diabetes. What should I know about preventing infection? Hepatitis B If you have a higher risk for hepatitis B, you should be screened for this virus. Talk with your health care provider to find out if you are at risk for hepatitis B infection. Hepatitis C Blood testing is recommended for: Everyone born from [birth year] through 1965. Anyone with known risk factors for hepatitis C. Sexually transmitted infections (STIs) You should be screened each year for STIs, including gonorrhea and chlamydia, if: You are sexually active and are younger than 28 years of age. You are older than 28 years of age and your health care provider tells you that you are at risk for this type of infection. Your sexual activity has changed since you were last screened, and you are at increased risk for chlamydia or gonorrhea. Ask your health care provider if you are at risk. Ask your health care provider about whether you are at high risk for HIV. Your health care provider may recommend a prescription medicine to help prevent HIV infection. If you choose to take medicine to prevent HIV, you should first get tested for HIV. You should then be tested every 3 months for as long as you are taking the medicine. Follow these instructions at home: Alcohol use Do not drink alcohol if your health care provider tells you not to drink. If you drink alcohol: Limit how much you have to 0-2 drinks a day. Know how much alcohol is in your drink. In the [place] one drink equals one 12 oz bottle of beer (355 mL), one 5 oz glass of wine (148 mL), or one 1  oz glass of hard  liquor (44 mL). Lifestyle Do not use any products that contain nicotine or tobacco. These products include cigarettes, chewing tobacco, and vaping devices, such as e-cigarettes. If you need help quitting, ask your health care provider. Do not use street drugs. Do not share needles. Ask your health care provider for help if you need support or information about quitting drugs. General instructions Schedule regular health, dental, and eye exams. Stay current with your vaccines. Tell your health care provider if: You often feel depressed. You have ever been abused or do not feel safe at home. Summary Adopting a healthy lifestyle and getting preventive care are important in promoting health and wellness. Follow your health care provider's instructions about healthy diet, exercising, and getting tested or screened for diseases. Follow your health care provider's instructions on monitoring your cholesterol and blood pressure. This information is not intended to replace advice given to you by your health care provider. Make sure you discuss any questions you have with your health care provider. Document Revised: 11/07/2020 Document Reviewed: 11/07/2020 Elsevier Patient Education  2024 Elsevier Inc.     Emil Schaumann, MD [facility]

## 2024-04-09 DIAGNOSIS — M79671 Pain in right foot: Secondary | ICD-10-CM | POA: Diagnosis not present

## 2024-04-10 ENCOUNTER — Other Ambulatory Visit: Payer: Self-pay | Admitting: Medical Genetics

## 2024-04-10 DIAGNOSIS — Z006 Encounter for examination for normal comparison and control in clinical research program: Secondary | ICD-10-CM

## 2024-04-22 DIAGNOSIS — M79671 Pain in right foot: Secondary | ICD-10-CM | POA: Diagnosis not present

## 2024-04-22 DIAGNOSIS — Z87312 Personal history of (healed) stress fracture: Secondary | ICD-10-CM | POA: Diagnosis not present

## 2024-05-01 LAB — GENECONNECT MOLECULAR SCREEN: Genetic Analysis Overall Interpretation: NEGATIVE

## 2024-05-02 ENCOUNTER — Encounter: Payer: Self-pay | Admitting: Emergency Medicine

## 2024-05-06 ENCOUNTER — Ambulatory Visit (INDEPENDENT_AMBULATORY_CARE_PROVIDER_SITE_OTHER): Admitting: Emergency Medicine

## 2024-05-06 ENCOUNTER — Encounter: Payer: Self-pay | Admitting: Emergency Medicine

## 2024-05-06 VITALS — BP 118/68 | HR 66 | Temp 98.0°F | Ht 73.0 in | Wt 179.0 lb

## 2024-05-06 DIAGNOSIS — T1490XA Injury, unspecified, initial encounter: Secondary | ICD-10-CM | POA: Insufficient documentation

## 2024-05-06 DIAGNOSIS — M79671 Pain in right foot: Secondary | ICD-10-CM | POA: Insufficient documentation

## 2024-05-06 NOTE — Patient Instructions (Signed)
 Foot Pain Many things can cause foot pain. Common causes include injuries to the foot. The injuries include sprains or broken bones, or injuries that affect the nerves in the feet. Other causes of foot pain include arthritis, blisters, and bunions. To know what causes your foot pain, your health care provider will take a detailed history of your symptoms. They will also do a physical exam as well as imaging tests, such as X-ray or MRI. Follow these instructions at home: Managing pain, stiffness, and swelling  If told, put ice on the painful area. Put ice in a plastic bag. Place a towel between your skin and the bag. Leave the ice on for 20 minutes, 2-3 times a day. If your skin turns bright red, remove the ice right away to prevent skin damage. The risk of damage is higher if you cannot feel pain, heat, or cold. Activity Do not stand or walk for long periods. Do stretches to relieve foot pain and stiffness as told by your provider. Do not lift anything that is heavier than 10 lb (4.5 kg), or the limit that you are told, until your provider says that it is safe. Lifting a lot of weight can put added pressure on your feet. Return to your normal activities as told by your provider. Ask your provider what activities are safe for you. Lifestyle Wear comfortable, supportive shoes that fit you well. Do not wear high heels. Keep your feet clean and dry. General instructions Take over-the-counter and prescription medicines only as told by your provider. Rub your foot gently. Pay attention to any changes in your symptoms. Let your provider know if symptoms become worse. Keep all follow-up visits. Your provider will want to monitor your progress. Contact a health care provider if: Your pain does not get better after a few days of treatment at home. Your pain gets worse. You cannot stand on your foot. Your foot or toes are swollen. Your foot is numb or tingling. Get help right away if: Your foot  or toes turn white or blue. You have warmth and redness along your foot. This information is not intended to replace advice given to you by your health care provider. Make sure you discuss any questions you have with your health care provider. Document Revised: 07/12/2022 Document Reviewed: 03/20/2022 Elsevier Patient Education  2024 [employer].

## 2024-05-06 NOTE — Assessment & Plan Note (Signed)
 Patient is a runner History of stress fractures in the past Recommend sports medicine evaluation Referral placed today

## 2024-05-06 NOTE — Progress Notes (Signed)
 Jon Leach 28 y.o.   Chief Complaint  Patient presents with   Follow-up    Pt has a history of stress fractures     HISTORY OF PRESENT ILLNESS: This is a 28 y.o. male complaining of pain to right foot Chronic issue stemming from sports injuries.  Running in particular. Was able to see orthopedist during the last month.  Recommended to consider bone density scan. Pain waxes and wanes depending on physical activity No other associated symptoms No other complaints or medical concerns today.  HPI   Prior to Admission medications   Medication Sig Start Date End Date Taking? Authorizing Provider  ketoconazole (NIZORAL) 2 % cream Apply 1 Application topically daily.   Yes [provider]  Levocetirizine Dihydrochloride (XYZAL PO) Take by mouth daily. As needed   Yes [provider]  tretinoin (RETIN-A) 0.025 % cream Apply topically at bedtime.   Yes [provider]    Allergies  Allergen Reactions   Penicillins     There are no active problems to display for this patient.   No past medical history on file.  Past Surgical History:  Procedure Laterality Date   EYE SURGERY     TONSILLECTOMY     tooth     wisdome tooth removed    Social History   Socioeconomic History   Marital status: Single    Spouse name: Not on file   Number of children: 0   Years of education: Not on file   Highest education level: Not on file  Occupational History   Not on file  Tobacco Use   Smoking status: Never   Smokeless tobacco: Never  Vaping Use   Vaping status: Never Used  Substance and Sexual Activity   Alcohol use: Yes    Comment: occ.    Drug use: No   Sexual activity: Yes  Other Topics Concern   Not on file  Social History Narrative   Not on file   Social Drivers of Health   Financial Resource Strain: Not on file  Food Insecurity: Not on file  Transportation Needs: Not on file  Physical Activity: Not on file  Stress: Not on file  Social  Connections: Not on file  Intimate Partner Violence: Not on file    Family History  Problem Relation Age of Onset   Healthy Mother    Healthy Father      Review of Systems  Constitutional: Negative.  Negative for chills and fever.  HENT: Negative.  Negative for congestion and sore throat.   Respiratory: Negative.  Negative for cough and shortness of breath.   Cardiovascular: Negative.  Negative for chest pain and palpitations.  Gastrointestinal:  Negative for abdominal pain, diarrhea, nausea and vomiting.  Genitourinary: Negative.  Negative for dysuria and hematuria.  Skin: Negative.  Negative for rash.  Neurological: Negative.  Negative for dizziness and headaches.  All other systems reviewed and are negative.   Vitals:   05/06/24 1051  BP: 118/68  Pulse: 66  Temp: 98 F (36.7 C)  SpO2: 97%    Physical Exam Vitals reviewed.  Constitutional:      Appearance: Normal appearance.  HENT:     Head: Normocephalic.  Eyes:     Extraocular Movements: Extraocular movements intact.  Cardiovascular:     Rate and Rhythm: Normal rate.  Pulmonary:     Effort: Pulmonary effort is normal.  Skin:    General: Skin is warm and dry.  Neurological:  Mental Status: He is alert and oriented to person, place, and time.  Psychiatric:        Mood and Affect: Mood normal.        Behavior: Behavior normal.      ASSESSMENT & PLAN: I personally spent a total of 30 minutes minutes in the care of the patient today including preparing to see the patient, getting/reviewing separately obtained history, performing a medically appropriate exam/evaluation, counseling and educating, placing orders, referring and communicating with other health care professionals, documenting clinical information in the EHR, coordinating care, and pain management, prognosis, and need for follow-up with sports medicine doctors.  Problem List Items Addressed This Visit       Other   Sports injury   Patient is  a runner History of stress fractures in the past Recommend sports medicine evaluation Referral placed today      Relevant Orders   Ambulatory referral to Sports Medicine   Right foot pain - Primary   History of stress fractures to right foot Related to excess running Was able to see orthopedist during the last month X-rays were done and read as negative Conservative treatment was started with only partial relief Bone density scan was recommended Symptoms persist and are affecting quality of life. Recommend evaluation by sports medicine department Referral placed today      Relevant Orders   Ambulatory referral to Sports Medicine   Patient Instructions  Foot Pain Many things can cause foot pain. Common causes include injuries to the foot. The injuries include sprains or broken bones, or injuries that affect the nerves in the feet. Other causes of foot pain include arthritis, blisters, and bunions. To know what causes your foot pain, your health care provider will take a detailed history of your symptoms. They will also do a physical exam as well as imaging tests, such as X-ray or MRI. Follow these instructions at home: Managing pain, stiffness, and swelling  If told, put ice on the painful area. Put ice in a plastic bag. Place a towel between your skin and the bag. Leave the ice on for 20 minutes, 2-3 times a day. If your skin turns bright red, remove the ice right away to prevent skin damage. The risk of damage is higher if you cannot feel pain, heat, or cold. Activity Do not stand or walk for long periods. Do stretches to relieve foot pain and stiffness as told by your provider. Do not lift anything that is heavier than 10 lb (4.5 kg), or the limit that you are told, until your provider says that it is safe. Lifting a lot of weight can put added pressure on your feet. Return to your normal activities as told by your provider. Ask your provider what activities are safe for  you. Lifestyle Wear comfortable, supportive shoes that fit you well. Do not wear high heels. Keep your feet clean and dry. General instructions Take over-the-counter and prescription medicines only as told by your provider. Rub your foot gently. Pay attention to any changes in your symptoms. Let your provider know if symptoms become worse. Keep all follow-up visits. Your provider will want to monitor your progress. Contact a health care provider if: Your pain does not get better after a few days of treatment at home. Your pain gets worse. You cannot stand on your foot. Your foot or toes are swollen. Your foot is numb or tingling. Get help right away if: Your foot or toes turn white or blue. You have  warmth and redness along your foot. This information is not intended to replace advice given to you by your health care provider. Make sure you discuss any questions you have with your health care provider. Document Revised: 07/12/2022 Document Reviewed: 03/20/2022 Elsevier Patient Education  2024 Elsevier Inc.   Emil Schaumann, MD [facility]

## 2024-05-06 NOTE — Assessment & Plan Note (Signed)
 History of stress fractures to right foot Related to excess running Was able to see orthopedist during the last month X-rays were done and read as negative Conservative treatment was started with only partial relief Bone density scan was recommended Symptoms persist and are affecting quality of life. Recommend evaluation by sports medicine department Referral placed today

## 2024-05-07 ENCOUNTER — Encounter: Payer: Self-pay | Admitting: Family Medicine

## 2024-05-07 ENCOUNTER — Other Ambulatory Visit: Payer: Self-pay

## 2024-05-07 ENCOUNTER — Ambulatory Visit: Admitting: Family Medicine

## 2024-05-07 VITALS — BP 112/68 | HR 70 | Ht 73.0 in | Wt 180.0 lb

## 2024-05-07 DIAGNOSIS — M859 Disorder of bone density and structure, unspecified: Secondary | ICD-10-CM

## 2024-05-07 DIAGNOSIS — M79671 Pain in right foot: Secondary | ICD-10-CM

## 2024-05-07 DIAGNOSIS — M84374S Stress fracture, right foot, sequela: Secondary | ICD-10-CM

## 2024-05-07 DIAGNOSIS — Z87312 Personal history of (healed) stress fracture: Secondary | ICD-10-CM | POA: Diagnosis not present

## 2024-05-07 DIAGNOSIS — M84374A Stress fracture, right foot, initial encounter for fracture: Secondary | ICD-10-CM | POA: Diagnosis not present

## 2024-05-07 LAB — COMPREHENSIVE METABOLIC PANEL WITH GFR
ALT: 20 U/L (ref 0–53)
AST: 21 U/L (ref 0–37)
Albumin: 4.7 g/dL (ref 3.5–5.2)
Alkaline Phosphatase: 46 U/L (ref 39–117)
BUN: 20 mg/dL (ref 6–23)
CO2: 30 meq/L (ref 19–32)
Calcium: 9.4 mg/dL (ref 8.4–10.5)
Chloride: 101 meq/L (ref 96–112)
Creatinine, Ser: 1.07 mg/dL (ref 0.40–1.50)
GFR: 94.53 mL/min (ref >60.00)
Glucose, Bld: 63 mg/dL — ABNORMAL LOW (ref 70–99)
Potassium: 3.9 meq/L (ref 3.5–5.1)
Sodium: 140 meq/L (ref 135–145)
Total Bilirubin: 0.6 mg/dL (ref 0.2–1.2)
Total Protein: 7.3 g/dL (ref 6.0–8.3)

## 2024-05-07 LAB — VITAMIN D 25 HYDROXY (VIT D DEFICIENCY, FRACTURES): VITD: 33.38 ng/mL (ref 30.00–100.00)

## 2024-05-07 NOTE — Patient Instructions (Addendum)
 Thank you for coming in today.   Please get labs today before you leave   Schedule DEXA scan

## 2024-05-07 NOTE — Progress Notes (Signed)
   I, Leotis Batter, CMA acting as a scribe for Artist Lloyd, MD.  Jon Leach is a 28 y.o. male who presents to [facility] today for R foot pain x 1 month. He notes hx of a stress fx around the 3-4th MT. Pt was seen on Oct 22nd at [facility] and was advised to update his DEXA. Pt locates pain to R 3rd-5th digits. Has stopped activity d/t pain.  Denies bruising or swelling. No meds, just stress. Hx of 3-4 stress fractures in both feet.   Dx testing: 02/11/20 R foot XR  05/28/16 DEXA  Pertinent review of systems: No fevers or chills  Relevant historical information: History of repeated stress fractures in both feet. Reduced bone density test on DEXA scan several years ago.  Exam:  BP 112/68   Pulse 70   Ht 6' 1 (1.854 m)   Wt 180 lb (81.6 kg)   SpO2 98%   BMI 23.75 kg/m  General: Well Developed, well nourished, and in no acute distress.   MSK: Normal gait. Sclera exam very slightly blue     Assessment and Plan: 28 y.o. male with repeated stress fractures.  This is unusual.  He is a fit healthy 28 year old man.  He has had previous bone density test that shows some low bone density.  Will go ahead and check bone density again as well as labs to assess parathyroid hormone access including vitamin D metabolic panel and PTH and ionized calcium.  He may have some mild phenotype of osteogenesis imperfecta as well.  We could consider genetic testing in the future.   PDMP not reviewed this encounter. Orders Placed This Encounter  Procedures   DG Bone Density    Standing Status:   Future    Expiration Date:   05/07/2025    Reason for Exam (SYMPTOM  OR DIAGNOSIS REQUIRED):   evaluate bone density    Preferred imaging location?:   [address]   Vitamin D (25 hydroxy)   Comprehensive metabolic panel with GFR    Standing Status:   Future    Number of Occurrences:   1    Expiration Date:   05/07/2025   PTH, Intact and Calcium   No orders of the  defined types were placed in this encounter.    Discussed warning signs or symptoms. Please see discharge instructions. Patient expresses understanding.   The above documentation has been reviewed and is accurate and complete Artist Lloyd, M.D.

## 2024-05-08 ENCOUNTER — Ambulatory Visit (INDEPENDENT_AMBULATORY_CARE_PROVIDER_SITE_OTHER)
Admission: RE | Admit: 2024-05-08 | Discharge: 2024-05-08 | Disposition: A | Discharge status: Home / Self Care | Source: Ambulatory Visit | Attending: Emergency Medicine | Admitting: Emergency Medicine

## 2024-05-08 DIAGNOSIS — M859 Disorder of bone density and structure, unspecified: Secondary | ICD-10-CM

## 2024-05-08 DIAGNOSIS — M84374S Stress fracture, right foot, sequela: Secondary | ICD-10-CM

## 2024-05-08 DIAGNOSIS — Z87312 Personal history of (healed) stress fracture: Secondary | ICD-10-CM | POA: Diagnosis not present

## 2024-05-08 DIAGNOSIS — M79671 Pain in right foot: Secondary | ICD-10-CM

## 2024-05-09 LAB — PTH, INTACT AND CALCIUM
Calcium: 9.4 mg/dL (ref 8.6–10.3)
PTH: 13 pg/mL — ABNORMAL LOW (ref 16–77)

## 2024-05-12 ENCOUNTER — Ambulatory Visit: Payer: Self-pay | Admitting: Family Medicine

## 2024-05-12 NOTE — Progress Notes (Signed)
Labs are okay

## 2024-05-12 NOTE — Progress Notes (Signed)
 Bone density test looks okay

## 2024-06-08 ENCOUNTER — Ambulatory Visit: Admitting: Family Medicine

## 2024-06-08 ENCOUNTER — Encounter: Payer: Self-pay | Admitting: Family Medicine

## 2024-06-08 ENCOUNTER — Telehealth: Payer: Self-pay

## 2024-06-08 VITALS — BP 118/82 | HR 72 | Ht 73.0 in | Wt 181.0 lb

## 2024-06-08 DIAGNOSIS — M79671 Pain in right foot: Secondary | ICD-10-CM | POA: Diagnosis not present

## 2024-06-08 DIAGNOSIS — Z87312 Personal history of (healed) stress fracture: Secondary | ICD-10-CM

## 2024-06-08 DIAGNOSIS — M84374S Stress fracture, right foot, sequela: Secondary | ICD-10-CM | POA: Diagnosis not present

## 2024-06-08 NOTE — Patient Instructions (Addendum)
 Thank you for coming in today.   [facility] Osteogenesis Imperfecta and Bone Fragility Panel Test code: 95267  67 genes  Use the post op shoe for another 2 weeks or so and then gradually introduce normal activity.

## 2024-06-08 NOTE — Telephone Encounter (Signed)
 Order [facility] genetic testing  [facility] Osteogenesis Imperfecta and Bone Fragility Panel Test code: 95267  67 genes

## 2024-06-08 NOTE — Progress Notes (Signed)
 I, Jon Leach, CMA acting as a scribe for Jon Lloyd, MD.  Jon Leach is a 28 y.o. male who presents to [facility] at [facility] today for f/u R foot stress fx. Pt was last seen by Dr. Lloyd on 05/07/24 and labs and DEXA scan were ordered.  Today, pt reports some worsening sx, went back into the boot 1-2 weeks ago, sx have improved since being back in the boot. No meds for pain.   Dx testing: 05/08/24 DEXA  05/07/24 Labs 02/11/20 R foot XR             05/28/16 DEXA  Pertinent review of systems: No fevers or chills  Relevant historical information: Recurrent fractures and stress fractures.  This includes foot structure fractures and a hamate fracture.   Exam:  BP 118/82   Pulse 72   Ht 6' 1 (1.854 m)   Wt 181 lb (82.1 kg)   SpO2 98%   BMI 23.88 kg/m  General: Well Developed, well nourished, and in no acute distress.   MSK: Right foot tender palpation forefoot.  Eye exam: Sclera is slightly below  Lab and Radiology Results  Date of study: 05/08/2024 Exam: DUAL X-RAY ABSORPTIOMETRY (DXA) FOR BONE MINERAL DENSITY (BMD) Instrument: [employer] Requesting Provider: Dr. Lloyd Indication: screening for osteoporosis in patient with right foot stress fracture Comparison: none (please note that it is not possible to compare data from different instruments) Clinical data: Pt is a 28 y.o. male without previous fragility (osteoporotic) fractures, but with history of stress fracture.  On vitamin D .   Results:   Lumbar spine L1-L4 Femoral neck (FN) 33% distal radius  Z-score +0.6 RFN: +0.6 LFN: +0.7 n/a    Assessment: the BMD is normal according to the [facility] classification for osteoporosis (see below).  Fracture risk: low FRAX score: not calculated due to normal BMD Comments: the technical quality of the study is good Recommend optimizing calcium (1200 mg/day) and vitamin D  (800 IU/day) intake.  No pharmacological treatment is indicated. Followup:  Repeat BMD is appropriate after 5 years.   WHO criteria for diagnosis of osteoporosis in postmenopausal women and in men 29 y/o or older:  - normal: T-score -1.0 to + 1.0 - osteopenia/low bone density: T-score between -2.5 and -1.0 - osteoporosis: T-score below -2.5 - severe osteoporosis: T-score below -2.5 with history of fragility fracture Note: although not part of the WHO classification, the presence of a fragility fracture, regardless of the T-score, should be considered diagnostic of osteoporosis, provided other causes for the fracture have been excluded.   Z scores are indicated for BMD evaluation in children, premenopausal women, and men younger than 29 years old.  Z scores < -2.0 are considered "below the expected range for age" in these populations.    Jon Fendt, MD [facility]      Assessment and Plan: 28 y.o. male with right foot metatarsal stress fracture.  Plan to continue postop shoe for another 2 weeks and then wean out of shoe and advance activity as tolerated.  On a more global issue Jon Leach is having recurrent injuries without a great explanation.  Bone density test was above normal which does make osteogenesis imperfecta less likely.  However his PTH level was slightly low.  Source of these findings are unclear.  Consider genetic testing.  The [facility] osteogenesis imperfecta panel does include multiple different genes that would potentially cause these conditions.  Consider doing that genetic test in the future.  PDMP not reviewed this encounter. No orders of the defined types were placed in this encounter.  No orders of the defined types were placed in this encounter.    Discussed warning signs or symptoms. Please see discharge instructions. Patient expresses understanding.   The above documentation has been reviewed and is accurate and complete Jon Leach, M.D.

## 2024-06-09 NOTE — Telephone Encounter (Signed)
 Ordered via [facility] portal.

## 2024-07-03 NOTE — Telephone Encounter (Signed)
 Message sent to [facility] to f/u on order. Will check back  on Monday.

## 2024-07-15 NOTE — Telephone Encounter (Signed)
 Cynthie Hui 07/07/2024 5:03 PM Hi Natoria Archibald, We are sending this patient a buccal swab kit now per your request. Thank you for letting us  know. -Cynthie, [facility]

## 2024-07-20 NOTE — Telephone Encounter (Signed)
 Report received and placed on Dr. Virgilio desk to review.

## 2024-08-06 NOTE — Progress Notes (Unsigned)
"       ° °  LILLETTE Ileana Collet, PhD, LAT, ATC acting as a scribe for Artist Lloyd, MD.  Jon Leach is a 29 y.o. male who presents to [facility] at [facility] today for f/u MT stress fx R foot and review of genetic testing. Pt was last seen by Dr. Lloyd on 06/08/24 and was advised to cont post-op shoe for another 2-wks and begin to wean out.   Today, pt reports ***  Dx testing: 05/08/24 DEXA             05/07/24 Labs 02/11/20 R foot XR             05/28/16 DEXA  Pertinent review of systems: ***  Relevant historical information: ***   Exam:  There were no vitals taken for this visit. General: Well Developed, well nourished, and in no acute distress.   MSK: ***    Lab and Radiology Results No results found for this or any previous visit (from the past 72 hours). No results found.     Assessment and Plan: 29 y.o. male with ***   PDMP not reviewed this encounter. No orders of the defined types were placed in this encounter.  No orders of the defined types were placed in this encounter.    Discussed warning signs or symptoms. Please see discharge instructions. Patient expresses understanding.   ***  "

## 2024-08-11 ENCOUNTER — Ambulatory Visit: Payer: Self-pay | Admitting: Family Medicine

## 2024-08-14 ENCOUNTER — Ambulatory Visit: Payer: Self-pay | Admitting: Family Medicine

## 2024-08-17 ENCOUNTER — Ambulatory Visit: Payer: Self-pay | Admitting: Family Medicine

## 2025-02-18 ENCOUNTER — Encounter: Admitting: Emergency Medicine
